# Patient Record
Sex: Male | Born: 1965 | Race: White | Hispanic: No | Marital: Married | Smoking: Current every day smoker
Health system: Southern US, Community
[De-identification: ages and names within clinical notes are randomized; demographics above are authoritative.]

## PROBLEM LIST (undated history)

## (undated) DIAGNOSIS — J449 Chronic obstructive pulmonary disease, unspecified: Secondary | ICD-10-CM

---

## 1998-03-07 ENCOUNTER — Emergency Department (HOSPITAL_COMMUNITY): Admission: EM | Admit: 1998-03-07 | Discharge: 1998-03-07 | Payer: Self-pay | Admitting: Emergency Medicine

## 1999-12-02 ENCOUNTER — Emergency Department (HOSPITAL_COMMUNITY): Admission: EM | Admit: 1999-12-02 | Discharge: 1999-12-02 | Payer: Self-pay | Admitting: Emergency Medicine

## 2002-12-24 ENCOUNTER — Encounter: Payer: Self-pay | Admitting: Emergency Medicine

## 2002-12-24 ENCOUNTER — Emergency Department (HOSPITAL_COMMUNITY): Admission: EM | Admit: 2002-12-24 | Discharge: 2002-12-24 | Payer: Self-pay | Admitting: Emergency Medicine

## 2003-01-04 ENCOUNTER — Encounter: Admission: RE | Admit: 2003-01-04 | Discharge: 2003-04-04 | Payer: Self-pay | Admitting: Orthopedic Surgery

## 2003-03-25 ENCOUNTER — Encounter: Payer: Self-pay | Admitting: Emergency Medicine

## 2003-03-25 ENCOUNTER — Emergency Department (HOSPITAL_COMMUNITY): Admission: AD | Admit: 2003-03-25 | Discharge: 2003-03-25 | Payer: Self-pay | Admitting: Emergency Medicine

## 2006-04-02 ENCOUNTER — Emergency Department (HOSPITAL_COMMUNITY): Admission: EM | Admit: 2006-04-02 | Discharge: 2006-04-02 | Payer: Self-pay | Admitting: Emergency Medicine

## 2008-10-05 ENCOUNTER — Emergency Department (HOSPITAL_COMMUNITY): Admission: EM | Admit: 2008-10-05 | Discharge: 2008-10-06 | Payer: Self-pay | Admitting: Emergency Medicine

## 2016-07-09 ENCOUNTER — Encounter (HOSPITAL_COMMUNITY): Payer: Self-pay | Admitting: Emergency Medicine

## 2016-07-09 ENCOUNTER — Emergency Department (HOSPITAL_COMMUNITY)
Admission: EM | Admit: 2016-07-09 | Discharge: 2016-07-10 | Disposition: A | Discharge status: Home / Self Care | Payer: Self-pay | Attending: Emergency Medicine | Admitting: Emergency Medicine

## 2016-07-09 ENCOUNTER — Emergency Department (HOSPITAL_COMMUNITY): Payer: Self-pay

## 2016-07-09 DIAGNOSIS — J449 Chronic obstructive pulmonary disease, unspecified: Secondary | ICD-10-CM | POA: Insufficient documentation

## 2016-07-09 DIAGNOSIS — Z87891 Personal history of nicotine dependence: Secondary | ICD-10-CM | POA: Insufficient documentation

## 2016-07-09 DIAGNOSIS — K859 Acute pancreatitis without necrosis or infection, unspecified: Secondary | ICD-10-CM | POA: Insufficient documentation

## 2016-07-09 DIAGNOSIS — N201 Calculus of ureter: Secondary | ICD-10-CM | POA: Insufficient documentation

## 2016-07-09 HISTORY — DX: Chronic obstructive pulmonary disease, unspecified: J44.9

## 2016-07-09 LAB — CBC
HCT: 46.3 % (ref 39.0–52.0)
Hemoglobin: 16.3 g/dL (ref 13.0–17.0)
MCH: 29.6 pg (ref 26.0–34.0)
MCHC: 35.2 g/dL (ref 30.0–36.0)
MCV: 84.2 fL (ref 78.0–100.0)
PLATELETS: 278 10*3/uL (ref 150–400)
RBC: 5.5 MIL/uL (ref 4.22–5.81)
RDW: 13.4 % (ref 11.5–15.5)
WBC: 15.6 10*3/uL — ABNORMAL HIGH (ref 4.0–10.5)

## 2016-07-09 LAB — COMPREHENSIVE METABOLIC PANEL
ALT: 28 U/L (ref 17–63)
AST: 30 U/L (ref 15–41)
Albumin: 4.8 g/dL (ref 3.5–5.0)
Alkaline Phosphatase: 97 U/L (ref 38–126)
Anion gap: 14 (ref 5–15)
BUN: 18 mg/dL (ref 6–20)
CHLORIDE: 100 mmol/L — AB (ref 101–111)
CO2: 24 mmol/L (ref 22–32)
CREATININE: 1.1 mg/dL (ref 0.61–1.24)
Calcium: 9.9 mg/dL (ref 8.9–10.3)
GFR calc Af Amer: >60 mL/min (ref >60)
GFR calc non Af Amer: >60 mL/min (ref >60)
Glucose, Bld: 138 mg/dL — ABNORMAL HIGH (ref 65–99)
Potassium: 3.2 mmol/L — ABNORMAL LOW (ref 3.5–5.1)
SODIUM: 138 mmol/L (ref 135–145)
Total Bilirubin: 2.7 mg/dL — ABNORMAL HIGH (ref 0.3–1.2)
Total Protein: 7.6 g/dL (ref 6.5–8.1)

## 2016-07-09 LAB — LIPASE, BLOOD: LIPASE: 116 U/L — AB (ref 11–51)

## 2016-07-09 LAB — I-STAT CG4 LACTIC ACID, ED
Lactic Acid, Venous: 1.23 mmol/L (ref 0.5–1.9)
Lactic Acid, Venous: 4.06 mmol/L (ref 0.5–1.9)

## 2016-07-09 MED ORDER — POTASSIUM CHLORIDE CRYS ER 20 MEQ PO TBCR
40.0000 meq | EXTENDED_RELEASE_TABLET | Freq: Once | ORAL | Status: AC
  Administered 2016-07-09: 40 meq via ORAL
  Filled 2016-07-09: qty 2

## 2016-07-09 MED ORDER — SODIUM CHLORIDE 0.9 % IV BOLUS (SEPSIS)
1000.0000 mL | Freq: Once | INTRAVENOUS | Status: AC
  Administered 2016-07-09: 1000 mL via INTRAVENOUS

## 2016-07-09 MED ORDER — HYDROMORPHONE HCL 1 MG/ML IJ SOLN
1.0000 mg | Freq: Once | INTRAMUSCULAR | Status: AC
  Administered 2016-07-09: 1 mg via INTRAVENOUS
  Filled 2016-07-09: qty 1

## 2016-07-09 MED ORDER — IOPAMIDOL (ISOVUE-300) INJECTION 61%
100.0000 mL | Freq: Once | INTRAVENOUS | Status: AC | PRN
  Administered 2016-07-09: 100 mL via INTRAVENOUS

## 2016-07-09 MED ORDER — METRONIDAZOLE IN NACL 5-0.79 MG/ML-% IV SOLN
500.0000 mg | Freq: Three times a day (TID) | INTRAVENOUS | Status: DC
  Administered 2016-07-09 – 2016-07-10 (×2): 500 mg via INTRAVENOUS
  Filled 2016-07-09 (×2): qty 100

## 2016-07-09 MED ORDER — ONDANSETRON HCL 4 MG/2ML IJ SOLN
4.0000 mg | Freq: Once | INTRAMUSCULAR | Status: AC
  Administered 2016-07-09: 4 mg via INTRAVENOUS
  Filled 2016-07-09: qty 2

## 2016-07-09 MED ORDER — SODIUM CHLORIDE 0.9 % IJ SOLN
INTRAMUSCULAR | Status: AC
  Filled 2016-07-09: qty 50

## 2016-07-09 MED ORDER — CIPROFLOXACIN IN D5W 400 MG/200ML IV SOLN
400.0000 mg | Freq: Two times a day (BID) | INTRAVENOUS | Status: DC
  Administered 2016-07-09: 400 mg via INTRAVENOUS
  Filled 2016-07-09 (×2): qty 200

## 2016-07-09 MED ORDER — IOPAMIDOL (ISOVUE-300) INJECTION 61%
INTRAVENOUS | Status: AC
  Filled 2016-07-09: qty 100

## 2016-07-09 NOTE — ED Notes (Addendum)
Pt. said he still not able to urinate at this time

## 2016-07-09 NOTE — ED Triage Notes (Signed)
Pt comes from visiting with family with complaints of right flank pain and abdominal pain since 1900 last night.  Urine dark in color per patient. 10/10 pain.  No hx of kidney stones.  BP 136/0 HR 80 RR 22 O2 100% RA.  Endorses N&V. Hx of COPD.  4 mg of zofran and 100 mcg of fentanyl given in route without relief.

## 2016-07-09 NOTE — ED Notes (Signed)
Writer notified EDP Tegeler of abnormal I-stat lactic

## 2016-07-09 NOTE — ED Provider Notes (Signed)
[facility]-EMERGENCY DEPT Provider Note   CSN: [id number] Arrival date & time: 07/09/16  1921     History   Chief Complaint Chief Complaint  Patient presents with  . Flank Pain  . Abdominal Pain    HPI Jeffery [name] is a 50 y.o. male with a past medical history significant for COPD who presents with right lower quadrant abdominal pain, right-sided flank pain, darkened urine, nausea, and vomiting. Patient reports that his symptoms began yesterday. He describes the pain as 10 out of 10 in severity, located in his right flank radiating towards his right lower quadrant, and associated with nausea and vomiting. Patient has not been able to eat or drink a significant amount today. He denies constipation, diarrhea, or dysuria. He does say that his urine was slightly dark today. He denies any history himself or his family of kidney stones. He reports chills but no fevers. He denies chest pain, shortness of breath, or any abdominal trauma.        The history is provided by the patient, medical records and a relative. No language interpreter was used.  Abdominal Pain   This is a new problem. The current episode started yesterday. The problem occurs constantly. The problem has been gradually worsening. The pain is located in the RLQ. The quality of the pain is dull and sharp. The pain is at a severity of 10/10. The pain is severe. Associated symptoms include nausea and vomiting. Pertinent negatives include fever, diarrhea, constipation, dysuria and frequency. The symptoms are aggravated by palpation.    Past Medical History:  Diagnosis Date  . COPD (chronic obstructive pulmonary disease) (HCC)     There are no active problems to display for this patient.   History reviewed. No pertinent surgical history.     Home Medications    Prior to Admission medications   Not on File    Family History History reviewed. No pertinent family history.  Social History Social History  Substance Use  Topics  . Smoking status: Former Games developer  . Smokeless tobacco: Never Used  . Alcohol use Not on file     Allergies   Amoxicillin   Review of Systems Review of Systems  Constitutional: Negative for chills, diaphoresis, fatigue and fever.  HENT: Negative for congestion.   Respiratory: Negative for chest tightness, shortness of breath, wheezing and stridor.   Cardiovascular: Negative for chest pain, palpitations and leg swelling.  Gastrointestinal: Positive for abdominal pain, nausea and vomiting. Negative for abdominal distention, constipation and diarrhea.  Genitourinary: Negative for decreased urine volume, dysuria, flank pain and frequency.  Musculoskeletal: Negative for back pain and neck stiffness.  Skin: Negative for rash and wound.  Neurological: Negative for dizziness, light-headedness and numbness.  Psychiatric/Behavioral: Negative for agitation and confusion.  All other systems reviewed and are negative.    Physical Exam Updated Vital Signs BP 122/89 (BP Location: Right Arm)   Pulse 89   Temp 99 F (37.2 C) (Oral)   Resp 20   Ht 5\' 9"  (1.753 m)   Wt 145 lb (65.8 kg)   SpO2 100%   BMI 21.41 kg/m   Physical Exam  Constitutional: He is oriented to person, place, and time. He appears well-developed and well-nourished.  HENT:  Head: Normocephalic and atraumatic.  Mouth/Throat: Oropharynx is clear and moist. No oropharyngeal exudate.  Eyes: Conjunctivae and EOM are normal. Pupils are equal, round, and reactive to light.  Neck: Normal range of motion. Neck supple.  Cardiovascular: Normal rate and regular  rhythm.   No murmur heard. Pulmonary/Chest: Effort normal and breath sounds normal. No stridor. No respiratory distress. He has no wheezes. He exhibits no tenderness.  Abdominal: Soft. There is tenderness.  Musculoskeletal: He exhibits no edema or tenderness.  Neurological: He is alert and oriented to person, place, and time. He displays normal reflexes. No cranial  nerve deficit or sensory deficit. He exhibits normal muscle tone. Coordination normal.  Skin: Skin is warm and dry. Capillary refill takes less than 2 seconds. No erythema. No pallor.  Psychiatric: He has a normal mood and affect.  Nursing note and vitals reviewed.    ED Treatments / Results  Labs (all labs ordered are listed, but only abnormal results are displayed) Labs Reviewed  LIPASE, BLOOD - Abnormal; Notable for the following:       Result Value   Lipase 116 (*)    All other components within normal limits  COMPREHENSIVE METABOLIC PANEL - Abnormal; Notable for the following:    Potassium 3.2 (*)    Chloride 100 (*)    Glucose, Bld 138 (*)    Total Bilirubin 2.7 (*)    All other components within normal limits  CBC - Abnormal; Notable for the following:    WBC 15.6 (*)    All other components within normal limits  URINALYSIS, ROUTINE W REFLEX MICROSCOPIC (NOT AT [facility]) - Abnormal; Notable for the following:    Specific Gravity, Urine >1.046 (*)    Ketones, ur 40 (*)    Protein, ur 30 (*)    All other components within normal limits  URINE MICROSCOPIC-ADD ON - Abnormal; Notable for the following:    Squamous Epithelial / LPF 0-5 (*)    Bacteria, UA RARE (*)    All other components within normal limits  I-STAT CG4 LACTIC ACID, ED - Abnormal; Notable for the following:    Lactic Acid, Venous 4.06 (*)    All other components within normal limits  CULTURE, BLOOD (ROUTINE X 2)  CULTURE, BLOOD (ROUTINE X 2)  I-STAT CG4 LACTIC ACID, ED    EKG  EKG Interpretation None       Radiology Ct Abdomen Pelvis W Contrast  Result Date: 07/09/2016 CLINICAL DATA:  50 y/o  M; right flank pain and dark urine. EXAM: CT ABDOMEN AND PELVIS WITH CONTRAST TECHNIQUE: Multidetector CT imaging of the abdomen and pelvis was performed using the standard protocol following bolus administration of intravenous contrast. CONTRAST:  1 ISOVUE-300 IOPAMIDOL (ISOVUE-300) INJECTION 61% COMPARISON:   None. FINDINGS: Lower chest: 3 mm left costal diaphragmatic angle nodule (series 4, image 40). Hepatobiliary: Subcentimeter lucencies in segment 5 and segment 7 of the liver likely representing cysts. No other focal liver abnormality. Normal gallbladder. No intra or extrahepatic biliary ductal dilatation. Pancreas: Unremarkable. No pancreatic ductal dilatation or surrounding inflammatory changes. Spleen: Normal in size without focal abnormality. Adrenals/Urinary Tract: Adrenal glands are unremarkable. Subcentimeter foci of hypoattenuation within the kidneys bilaterally the largest in the left lower pole measuring 6 mm probably represent cysts. Kidneys are otherwise normal, without renal calculi, focal lesion, or hydronephrosis. Bladder is unremarkable. Stomach/Bowel: Stomach is within normal limits. Appendix appears normal. No evidence of bowel wall thickening, distention, or inflammatory changes. Vascular/Lymphatic: Aortic atherosclerosis. No enlarged abdominal or pelvic lymph nodes. Reproductive: 2 mm calcification within the central prostate the expected location of prostatic ureter may represent a passing stone (series 2, image 74). Other: No abdominal wall hernia or abnormality. No abdominopelvic ascites. Musculoskeletal: No acute or significant osseous findings.  IMPRESSION: 1. No hydronephrosis or obstructive uropathy. 2. 2 mm calcification within central prostate expected location of prostatic ureter may represent a passing stone. 3. Mild aortic atherosclerosis. 4. 3 mm left lower lobe pulmonary nodule. No follow-up needed if patient is low-risk. Non-contrast chest CT can be considered in 12 months if patient is high-risk. This recommendation follows the consensus statement: Guidelines for Management of Incidental Pulmonary Nodules Detected on CT Images: From the Fleischner Society 2017; Radiology 2017; 284:228-243. Electronically Signed   By: Mitzi HansenLance  Furusawa-Stratton M.D.   On: 07/09/2016 22:17     Procedures Procedures (including critical care time)  Medications Ordered in ED Medications  sodium chloride 0.9 % bolus 1,000 mL (0 mLs Intravenous Stopped 07/09/16 2217)  sodium chloride 0.9 % bolus 1,000 mL (0 mLs Intravenous Stopped 07/09/16 2325)  HYDROmorphone (DILAUDID) injection 1 mg (1 mg Intravenous Given 07/09/16 2116)  ondansetron (ZOFRAN) injection 4 mg (4 mg Intravenous Given 07/09/16 2116)  potassium chloride SA (K-DUR,KLOR-CON) CR tablet 40 mEq (40 mEq Oral Given 07/09/16 2116)  iopamidol (ISOVUE-300) 61 % injection 100 mL (100 mLs Intravenous Contrast Given 07/09/16 2154)  sodium chloride 0.9 % bolus 1,000 mL (0 mLs Intravenous Stopped 07/10/16 0058)  ondansetron (ZOFRAN) injection 4 mg (4 mg Intravenous Given 07/10/16 0057)  HYDROmorphone (DILAUDID) injection 1 mg (1 mg Intravenous Given 07/10/16 0058)  oxyCODONE-acetaminophen (PERCOCET/ROXICET) 5-325 MG per tablet 1 tablet (1 tablet Oral Given 07/10/16 0706)     Initial Impression / Assessment and Plan / ED Course  I have reviewed the triage vital signs and the nursing notes.  Pertinent labs & imaging results that were available during my care of the patient were reviewed by me and considered in my medical decision making (see chart for details).  Clinical Course     Blondell RevealGlen B Binney is a 50 y.o. male with a past medical history significant for COPD who presents with right lower quadrant abdominal pain, right-sided flank pain, darkened urine, nausea, and vomiting.  History and exam are seen above.  Patient has tenderness in his right flank, and right lower quadrant. No significant CVA tenderness. No scrotal swelling. No penile lesions. No hernias. Cremasteric reflex symmetric. Lungs clear and neurologic exam unremarkable.  Laboratory and imaging testing ordered to rule out appendicitis versus kidney stone. Initial lactic acid elevated at 4.06. After this finding, patient was made a code sepsis and broad-spectrum  antibiotics were ordered to cover for intra-abdominal sections. Leukocytosis was found at 15.6. Lipase slightly elevated at 116.  CT imaging ordered with no evidence of appendicitis. There is a 2 mm kidney stone in the prostatic ureter. No evidence of hydronephrosis. No evidence of bowel abnormalities.  He reported improvement in pain after pain medicine, improvement in nausea after Zofran, and felt better in general after fluids.  Patient is still awaiting urinalysis to look for UTI.  Patient's lactic acid cleared after fluids. Suspect dehydration in setting of nausea and vomiting. Suspect symptoms secondary to passing kidney stone. If urinalysis is negative for infection, and patient has continued resolution of symptoms, feel patient is stable for discharge. Patient has evidence of UTI or unmanaged symptoms, would consider admitting patient given significantly elevated lactic acid on arrival.  Care transferred to Dr. Lynelle DoctorKnapp to await urinalysis results and reassessment following pain medication administration. Care transferred in stable condition.    Final Clinical Impressions(s) / ED Diagnoses   Final diagnoses:  Right ureteral stone  Acute pancreatitis without infection or necrosis, unspecified pancreatitis type  Clinical Impression: 1. Right ureteral stone   2. Acute pancreatitis without infection or necrosis, unspecified pancreatitis type     Disposition: Transfer to Dr. Lynelle Doctor with likely subsequent discharge  Condition: Stable      Canary Brim Ras Kollman, MD 07/10/16 [phone]

## 2016-07-09 NOTE — ED Triage Notes (Signed)
Pt presents to ED via [facility] with c/o Right lower quadrant and right flank pain since yesterday, associated with nausea and vomiting and chills.

## 2016-07-09 NOTE — ED Notes (Signed)
Bed: [facility]02 Expected date:  Expected time:  Means of arrival:  Comments: EMS abd and flank pain/IV fentanyl and zofran

## 2016-07-10 LAB — URINALYSIS, ROUTINE W REFLEX MICROSCOPIC
Bilirubin Urine: NEGATIVE
Glucose, UA: NEGATIVE mg/dL
HGB URINE DIPSTICK: NEGATIVE
Ketones, ur: 40 mg/dL — AB
Leukocytes, UA: NEGATIVE
NITRITE: NEGATIVE
PH: 8 (ref 5.0–8.0)
Protein, ur: 30 mg/dL — AB

## 2016-07-10 LAB — URINE MICROSCOPIC-ADD ON

## 2016-07-10 MED ORDER — PERCOCET 5-325 MG PO TABS: 1.0000 | ORAL_TABLET | Freq: Four times a day (QID) | ORAL | 0 refills | Status: DC | PRN

## 2016-07-10 MED ORDER — HYDROMORPHONE HCL 1 MG/ML IJ SOLN
1.0000 mg | Freq: Once | INTRAMUSCULAR | Status: AC
  Administered 2016-07-10: 1 mg via INTRAVENOUS
  Filled 2016-07-10: qty 1

## 2016-07-10 MED ORDER — ONDANSETRON 4 MG PO TBDP: 4.0000 mg | ORAL_TABLET | Freq: Three times a day (TID) | ORAL | 0 refills | Status: DC | PRN

## 2016-07-10 MED ORDER — ONDANSETRON HCL 4 MG/2ML IJ SOLN
4.0000 mg | Freq: Once | INTRAMUSCULAR | Status: AC
  Administered 2016-07-10: 4 mg via INTRAVENOUS
  Filled 2016-07-10: qty 2

## 2016-07-10 MED ORDER — OXYCODONE-ACETAMINOPHEN 5-325 MG PO TABS
1.0000 | ORAL_TABLET | Freq: Once | ORAL | Status: AC
  Administered 2016-07-10: 1 via ORAL
  Filled 2016-07-10: qty 1

## 2016-07-10 NOTE — Discharge Instructions (Addendum)
Your laboratory results show you have a mild inflammation of your pancreas. It is so mild that it does not show up on the CT scan. You also had a kidney stone that the CT scan shows you were close to passing while in the ED.  do not eat any solid food for the next 2 days, only drink clear liquids. Take the pain medication as prescribed, use the nausea medication as needed. Return to the ED if you get a fever, have uncontrolled vomiting or pain.

## 2016-07-10 NOTE — ED Notes (Signed)
Pt understood discharge instructions.

## 2016-07-10 NOTE — ED Provider Notes (Signed)
Patient left at change of shift to check urinalysis results. However due to other blood testing that was done it never triggered the color change for me to know what was done. Patient was rechecked at 6:45 AM. He states his pain is returning. He shows me is having epigastric pain that radiates into his back. He denies any alcohol abuse. His lipase was noted to be elevated however his CT scan does not show any acute inflammation of the pancreas. This pain would be consistent with pancreatitis which is probably mild since it is not very evident on CT scan. Patient is willing to try oral pain medication so he can go home. At this point he does not have any pain in his groin to go along with the possible prostatic ureteral stone that was seen on CT scan.  Recheck at 7:50 AM after getting oral Percocet. He states his pain is improved. We discussed going home and he wants to try outpatient treatment before admission.  Review of the [place] database shows no entries in the last 6 months.  Diagnoses that have been ruled out:  None  Diagnoses that are still under consideration:  None  Final diagnoses:  Right ureteral stone  Acute pancreatitis without infection or necrosis, unspecified pancreatitis type   New Prescriptions   ONDANSETRON (ZOFRAN ODT) 4 MG DISINTEGRATING TABLET    Take 1 tablet (4 mg total) by mouth every 8 (eight) hours as needed for nausea or vomiting.   PERCOCET 5-325 MG TABLET    Take 1 tablet by mouth every 6 (six) hours as needed for severe pain.    Plan discharge  Devoria AlbeIva Jeyson Deshotel, MD, [facility]P    Arlind Klingerman, MD 07/10/16 [phone]

## 2016-07-15 LAB — CULTURE, BLOOD (ROUTINE X 2)
Culture: NO GROWTH
Culture: NO GROWTH

## 2018-02-01 ENCOUNTER — Emergency Department (HOSPITAL_COMMUNITY): Payer: Self-pay

## 2018-02-01 ENCOUNTER — Other Ambulatory Visit: Payer: Self-pay

## 2018-02-01 ENCOUNTER — Emergency Department (HOSPITAL_COMMUNITY)
Admission: EM | Admit: 2018-02-01 | Discharge: 2018-02-01 | Disposition: A | Discharge status: Home / Self Care | Payer: Self-pay | Attending: Emergency Medicine | Admitting: Emergency Medicine

## 2018-02-01 ENCOUNTER — Encounter (HOSPITAL_COMMUNITY): Payer: Self-pay

## 2018-02-01 DIAGNOSIS — R1032 Left lower quadrant pain: Secondary | ICD-10-CM | POA: Insufficient documentation

## 2018-02-01 DIAGNOSIS — L729 Follicular cyst of the skin and subcutaneous tissue, unspecified: Secondary | ICD-10-CM | POA: Insufficient documentation

## 2018-02-01 DIAGNOSIS — Z79899 Other long term (current) drug therapy: Secondary | ICD-10-CM | POA: Insufficient documentation

## 2018-02-01 DIAGNOSIS — J449 Chronic obstructive pulmonary disease, unspecified: Secondary | ICD-10-CM | POA: Insufficient documentation

## 2018-02-01 DIAGNOSIS — Z87891 Personal history of nicotine dependence: Secondary | ICD-10-CM | POA: Insufficient documentation

## 2018-02-01 DIAGNOSIS — R748 Abnormal levels of other serum enzymes: Secondary | ICD-10-CM | POA: Insufficient documentation

## 2018-02-01 DIAGNOSIS — N433 Hydrocele, unspecified: Secondary | ICD-10-CM | POA: Insufficient documentation

## 2018-02-01 DIAGNOSIS — I861 Scrotal varices: Secondary | ICD-10-CM | POA: Insufficient documentation

## 2018-02-01 DIAGNOSIS — R109 Unspecified abdominal pain: Secondary | ICD-10-CM

## 2018-02-01 LAB — COMPREHENSIVE METABOLIC PANEL
ALT: 27 U/L (ref 17–63)
AST: 22 U/L (ref 15–41)
Albumin: 4 g/dL (ref 3.5–5.0)
Alkaline Phosphatase: 97 U/L (ref 38–126)
Anion gap: 7 (ref 5–15)
BUN: 16 mg/dL (ref 6–20)
CHLORIDE: 100 mmol/L — AB (ref 101–111)
CO2: 29 mmol/L (ref 22–32)
CREATININE: 1.26 mg/dL — AB (ref 0.61–1.24)
Calcium: 9.4 mg/dL (ref 8.9–10.3)
GFR calc Af Amer: >60 mL/min (ref >60)
Glucose, Bld: 138 mg/dL — ABNORMAL HIGH (ref 65–99)
Potassium: 4.4 mmol/L (ref 3.5–5.1)
Sodium: 136 mmol/L (ref 135–145)
Total Bilirubin: 1 mg/dL (ref 0.3–1.2)
Total Protein: 7 g/dL (ref 6.5–8.1)

## 2018-02-01 LAB — CBC
HCT: 50.1 % (ref 39.0–52.0)
Hemoglobin: 16.5 g/dL (ref 13.0–17.0)
MCH: 29.4 pg (ref 26.0–34.0)
MCHC: 32.9 g/dL (ref 30.0–36.0)
MCV: 89.3 fL (ref 78.0–100.0)
PLATELETS: 271 10*3/uL (ref 150–400)
RBC: 5.61 MIL/uL (ref 4.22–5.81)
RDW: 13.5 % (ref 11.5–15.5)
WBC: 13.1 10*3/uL — AB (ref 4.0–10.5)

## 2018-02-01 LAB — URINALYSIS, ROUTINE W REFLEX MICROSCOPIC
BILIRUBIN URINE: NEGATIVE
GLUCOSE, UA: 50 mg/dL — AB
Hgb urine dipstick: NEGATIVE
KETONES UR: NEGATIVE mg/dL
LEUKOCYTES UA: NEGATIVE
Nitrite: NEGATIVE
PROTEIN: NEGATIVE mg/dL
Specific Gravity, Urine: 1.014 (ref 1.005–1.030)
pH: 7 (ref 5.0–8.0)

## 2018-02-01 LAB — LIPASE, BLOOD: LIPASE: 97 U/L — AB (ref 11–51)

## 2018-02-01 MED ORDER — IOHEXOL 300 MG/ML  SOLN
100.0000 mL | Freq: Once | INTRAMUSCULAR | Status: AC
  Administered 2018-02-01: 100 mL via INTRAVENOUS

## 2018-02-01 MED ORDER — SODIUM CHLORIDE 0.9 % IV BOLUS
1000.0000 mL | Freq: Once | INTRAVENOUS | Status: AC
  Administered 2018-02-01: 1000 mL via INTRAVENOUS

## 2018-02-01 MED ORDER — ONDANSETRON 4 MG PO TBDP: 4.0000 mg | ORAL_TABLET | Freq: Three times a day (TID) | ORAL | 0 refills | Status: DC | PRN

## 2018-02-01 MED ORDER — ONDANSETRON HCL 4 MG/2ML IJ SOLN
4.0000 mg | Freq: Once | INTRAMUSCULAR | Status: AC
  Administered 2018-02-01: 4 mg via INTRAVENOUS
  Filled 2018-02-01: qty 2

## 2018-02-01 MED ORDER — HYDROMORPHONE HCL 2 MG/ML IJ SOLN
1.0000 mg | Freq: Once | INTRAMUSCULAR | Status: AC
  Administered 2018-02-01: 1 mg via INTRAVENOUS
  Filled 2018-02-01: qty 1

## 2018-02-01 MED ORDER — OXYCODONE-ACETAMINOPHEN 5-325 MG PO TABS
1.0000 | ORAL_TABLET | Freq: Once | ORAL | Status: AC
  Administered 2018-02-01: 1 via ORAL
  Filled 2018-02-01: qty 1

## 2018-02-01 MED ORDER — OXYCODONE-ACETAMINOPHEN 5-325 MG PO TABS: 1.0000 | ORAL_TABLET | ORAL | 0 refills | Status: DC | PRN

## 2018-02-01 NOTE — ED Notes (Signed)
Patient transported to CT 

## 2018-02-01 NOTE — ED Triage Notes (Addendum)
Pt reports abdominal pain X2 days. He reports some pain in the right side of his back as well. Pt reports nausea and vomiting as well as testicle pain. Pt reports he is unable to have a steady stream of urine. Hx of kidney stone.

## 2018-02-01 NOTE — ED Notes (Signed)
Pt provided with ice water

## 2018-02-01 NOTE — ED Notes (Signed)
EDP at the bedside.  ?

## 2018-02-01 NOTE — Discharge Instructions (Signed)
Thank you for allowing me to provide your care today in the emergency department.  Please call and schedule follow-up with urology.  Dr. Alphonsa OverallWinter's number as listed above.  For mild to moderate pain, take 600 mg of ibuprofen with food or 6 and 50 mg of Tylenol every 6 hours for pain control.  For severe pain, you can take 1 tablet of Percocet every 8 hours as needed.  Please do not drive or work while taking this medication because it is a narcotic and can cause you to be impaired.   You can also let 1 tablet of Zofran dissolve in your tongue every 8 hours as needed for nausea or vomiting.  Return to the emergency department if you develop new or worsening symptoms including persistent vomiting despite taking Zofran, severe uncontrollable pain, high fever, or other new concerning symptoms.

## 2018-02-01 NOTE — ED Provider Notes (Signed)
[facility] EMERGENCY DEPARTMENT Provider Note   CSN: [id number] Arrival date & time: 02/01/18  4098     History   Chief Complaint Chief Complaint  Patient presents with  . Abdominal Pain    HPI Jeffery [name] is a 52 y.o. male with a h/o of COPD and nephrolithiasis who presents to the Emergency Department with a chief complaint of abdominal pain.  The patient endorses constant, worsening left-sided abdominal pain, characterized as sharp, that began 2 days ago.  He reports the pain initially felt like when he had previously had a kidney stone. Abdominal pain is now diffuse. He also endorses nausea, NBNB emesis x2 that began yesterday afternoon, and left-sided testicular pain.  He states that he felt warm last night like he might have a temperature, but did not measure his temperature at home.  He also reports increased pressure and discomfort with voiding over the last few weeks.  He denies dysuria, frequency, hematuria, penile or testicular swelling, penile discharge, diarrhea, constipation, rectal pain, melena, hematochezia, or chills.  He took 400 mg of ibuprofen this morning with no improvement in his symptoms.  No other treatment prior to arrival.  He does not have a PCP.  He denies alcohol use and states that he has not drank alcohol in over 5 years.  He is a former smoker.  No recreational or IV drug use.  The history is provided by the patient. No language interpreter was used.    Past Medical History:  Diagnosis Date  . COPD (chronic obstructive pulmonary disease) (HCC)     There are no active problems to display for this patient.   History reviewed. No pertinent surgical history.    Home Medications    Prior to Admission medications   Medication Sig Start Date End Date Taking? Authorizing Provider  ibuprofen (ADVIL,MOTRIN) 200 MG tablet Take 400 mg by mouth every 6 (six) hours as needed for moderate pain.   Yes [provider]    ondansetron (ZOFRAN ODT) 4 MG disintegrating tablet Take 1 tablet (4 mg total) by mouth every 8 (eight) hours as needed for nausea or vomiting. 02/01/18   Daphyne Miguez A, PA-C  oxyCODONE-acetaminophen (PERCOCET/ROXICET) 5-325 MG tablet Take 1 tablet by mouth every 4 (four) hours as needed for severe pain. 02/01/18   Laiylah Roettger, Coral Else, PA-C    Family History History reviewed. No pertinent family history.  Social History Social History   Tobacco Use  . Smoking status: Former Games developer  . Smokeless tobacco: Never Used  Substance Use Topics  . Alcohol use: Not Currently  . Drug use: Not Currently     Allergies   Amoxicillin   Review of Systems Review of Systems  Constitutional: Negative for appetite change and fever.  Respiratory: Negative for shortness of breath.   Cardiovascular: Negative for chest pain.  Gastrointestinal: Positive for abdominal pain, nausea and vomiting. Negative for abdominal distention, anal bleeding, blood in stool, constipation, diarrhea and rectal pain.  Genitourinary: Positive for flank pain, frequency, testicular pain and urgency. Negative for discharge, dysuria, genital sores, hematuria, penile pain, penile swelling and scrotal swelling.  Musculoskeletal: Positive for back pain. Negative for myalgias, neck pain and neck stiffness.  Skin: Negative for rash.  Allergic/Immunologic: Negative for immunocompromised state.  Neurological: Negative for headaches.  Psychiatric/Behavioral: Negative for confusion.   Physical Exam Updated Vital Signs BP (!) 131/98   Pulse 66   Temp 98.6 F (37 C) (Oral)   Resp 16  SpO2 100%   Physical Exam  Constitutional: He appears well-developed.  HENT:  Head: Normocephalic.  Eyes: Conjunctivae are normal.  Neck: Normal range of motion. Neck supple. No JVD present.  Cardiovascular: Normal rate, regular rhythm, normal heart sounds and intact distal pulses. Exam reveals no gallop and no friction rub.  No murmur  heard. Pulmonary/Chest: Effort normal and breath sounds normal. No stridor. No respiratory distress. He has no wheezes. He has no rales. He exhibits no tenderness.  Abdominal: Soft. He exhibits no distension and no mass. There is tenderness. There is guarding. No hernia. Hernia confirmed negative in the right inguinal area and confirmed negative in the left inguinal area.  Genitourinary: Rectum normal, prostate normal and penis normal. Rectal exam shows no external hemorrhoid and anal tone normal. Right testis shows no tenderness. Left testis shows tenderness.  Genitourinary Comments: Chaperoned exam.  Bilateral inguinal lymphadenopathy.  No inguinal hernias bilaterally.  Rectal exam is normal.  Musculoskeletal: Normal range of motion. He exhibits tenderness. He exhibits no edema or deformity.  No tenderness to the spinous processes of the cervical, thoracic, or lumbar spine.  He has midline tenderness to the sacral spine with minimal surrounding bilateral paraspinal muscular tenderness.  Lymphadenopathy: Inguinal adenopathy noted on the right and left side.  Neurological: He is alert.  Skin: Skin is warm and dry. Capillary refill takes less than 2 seconds. No rash noted. No erythema. No pallor.  Psychiatric: His behavior is normal.  Nursing note and vitals reviewed.  ED Treatments / Results  Labs (all labs ordered are listed, but only abnormal results are displayed) Labs Reviewed  LIPASE, BLOOD - Abnormal; Notable for the following components:      Result Value   Lipase 97 (*)    All other components within normal limits  COMPREHENSIVE METABOLIC PANEL - Abnormal; Notable for the following components:   Chloride 100 (*)    Glucose, Bld 138 (*)    Creatinine, Ser 1.26 (*)    All other components within normal limits  CBC - Abnormal; Notable for the following components:   WBC 13.1 (*)    All other components within normal limits  URINALYSIS, ROUTINE W REFLEX MICROSCOPIC - Abnormal;  Notable for the following components:   Glucose, UA 50 (*)    All other components within normal limits    EKG None  Radiology Ct Abdomen Pelvis W Contrast  Result Date: 02/01/2018 CLINICAL DATA:  Nausea, vomiting EXAM: CT ABDOMEN AND PELVIS WITH CONTRAST TECHNIQUE: Multidetector CT imaging of the abdomen and pelvis was performed using the standard protocol following bolus administration of intravenous contrast. CONTRAST:  OMNIPAQUE IOHEXOL 300 MG/ML  SOLN COMPARISON:  02/01/2017 FINDINGS: Lower chest: Lung bases are clear. No effusions. Heart is normal size. Hepatobiliary: Small scattered hypodensities in the liver, likely cysts. Gallbladder unremarkable. Pancreas: No focal abnormality or ductal dilatation. Spleen: No focal abnormality.  Normal size. Adrenals/Urinary Tract: No adrenal abnormality. No focal renal abnormality. No stones or hydronephrosis. Urinary bladder is unremarkable. Stomach/Bowel: Normal appendix. Stomach, large and small bowel grossly unremarkable. Vascular/Lymphatic: Aortic atherosclerosis. No enlarged abdominal or pelvic lymph nodes. Reproductive: No visible focal abnormality. Other: No free fluid or free air. Musculoskeletal: No acute bony abnormality. IMPRESSION: No acute findings in the abdomen or pelvis. Aortic atherosclerosis. Scattered hypodensities in the liver, likely small cysts. These are stable since prior study. Electronically Signed   By: Charlett Nose M.D.   On: 02/01/2018 12:11   US Scrotum W/doppler  Result Date: 02/01/2018  CLINICAL DATA:  Scrotal region pain on the left for 2 days EXAM: SCROTAL ULTRASOUND DOPPLER ULTRASOUND OF THE TESTICLES TECHNIQUE: Complete ultrasound examination of the testicles, epididymis, and other scrotal structures was performed. Color and spectral Doppler ultrasound were also utilized to evaluate blood flow to the testicles. COMPARISON:  None. FINDINGS: Right testicle Measurements: 4.6 x 2.9 x 2.6 cm. No mass or microlithiasis  visualized. Left testicle Measurements: 4.4 x 2.7 x 3.1 cm. No mass or microlithiasis visualized. Right epididymis: There is a cyst arising from the head of the epididymis on the right measuring 6 x 6 x 6 mm. No inflammatory focus noted in the right epididymis. Left epididymis: Normal in size and appearance. No inflammatory focus. Hydrocele: There is a small hydrocele on the right. There is no appreciable hydrocele on the left. Varicocele: There is a moderate varicocele on the left with Valsalva maneuver. No appreciable varicocele on the right. Pulsed Doppler interrogation of both testes demonstrates normal low resistance arterial and venous waveforms bilaterally. No scrotal wall thickening or scrotal abscess on either side. IMPRESSION: 1. Small cyst arising from the epididymal head on the right. No epididymitis on either side. No evidence of orchitis on either side. 2. No intratesticular mass on either side. No testicular torsion on either side. 3.  Small right right hydrocele.  No appreciable left hydrocele. 4.  Moderate varicocele on the left noted with Valsalva maneuver. Electronically Signed   By: Bretta Bang III M.D.   On: 02/01/2018 10:12    Procedures Procedures (including critical care time)  Medications Ordered in ED Medications  ondansetron (ZOFRAN) injection 4 mg (4 mg Intravenous Given 02/01/18 1020)  HYDROmorphone (DILAUDID) injection 1 mg (1 mg Intravenous Given 02/01/18 1022)  iohexol (OMNIPAQUE) 300 MG/ML solution 100 mL (100 mLs Intravenous Contrast Given 02/01/18 1153)  sodium chloride 0.9 % bolus 1,000 mL (0 mLs Intravenous Stopped 02/01/18 1554)  oxyCODONE-acetaminophen (PERCOCET/ROXICET) 5-325 MG per tablet 1 tablet (1 tablet Oral Given 02/01/18 1447)     Initial Impression / Assessment and Plan / ED Course  I have reviewed the triage vital signs and the nursing notes.  Pertinent labs & imaging results that were available during my care of the patient were reviewed by me and  considered in my medical decision making (see chart for details).     52 year old male with a history of COPD and nephrolithiasis presenting with left-sided abdominal and scrotal pain, nausea, and vomiting.   Pain controlled in the ED.  Zofran given for nausea.  On exam, the patient is significantly tender to palpation of the left scrotum.  Right scrotum is nontender.  Given acute onset of severe, unilateral testicular pain, scrotal ultrasound ordered, which is negative for testicular torsion.  Ultrasound does demonstrate a small cyst arising from the right epididymal head, but no epididymitis or orchitis.  There is also a small right hydrocele and moderate varicocele on the left with Valsalva maneuver.  On reevaluation, the patient reports he has previously noted the varicocele on previous testicular exams.  This is not acute.  CT abdomen pelvis is unremarkable.  No nephro or ureterolithiasis.  On reevaluation, the patient reports that the previous pain medicine has worn off and he is now in a significant amount of pain.  Percocet ordered for reevaluation.    Labs are notable for lipase of 97.  The patient denies alcohol use.  It is noted that when the patient was admitted in November 2017 that his lipase was also elevated at  that time.  I discussed these findings with the patient and have recommended outpatient follow-up for further evaluation.  Creatinine minimally elevated at 1.26.  IV fluid bolus given.  Mild leukocytosis of 13.1.  UA with mild glucosuria, but otherwise unremarkable.  The patient was also successfully fluid challenged.  The patient was discussed with Dr. Rhunette CroftNanavati, attending physician.  No clinical findings to suggest prostatitis, epididymitis, or orchitis.  The patient has no risk factors for Fournier's.  No evidence of GU cellulitis.   Recommended follow-up with urology.  Will discharge the patient with pain control.  A 3883-month prescription history query was performed using  the [place] CSRS prior to discharge.  He is hemodynamically stable and in no acute distress.  Strict return precautions to the ED are given.  He is safe for discharge to home with outpatient follow-up to urology at this time.   Final Clinical Impressions(s) / ED Diagnoses   Final diagnoses:  Elevated lipase  Left sided abdominal pain  Left varicocele  Hydrocele, right  Scrotal cyst    ED Discharge Orders        Ordered    oxyCODONE-acetaminophen (PERCOCET/ROXICET) 5-325 MG tablet  Every 4 hours PRN     02/01/18 1548    ondansetron (ZOFRAN ODT) 4 MG disintegrating tablet  Every 8 hours PRN     02/01/18 1549       Kallen Delatorre A, PA-C 02/01/18 1726    Derwood KaplanNanavati, Ankit, MD 02/03/18 1506

## 2018-02-01 NOTE — ED Notes (Signed)
Pt returned from imaging.

## 2021-05-07 ENCOUNTER — Emergency Department (HOSPITAL_COMMUNITY): Payer: Self-pay

## 2021-05-07 ENCOUNTER — Inpatient Hospital Stay (HOSPITAL_COMMUNITY)
Admission: EM | Admit: 2021-05-07 | Discharge: 2021-05-14 | DRG: 328 | Disposition: A | Discharge status: Home / Self Care | Payer: Self-pay | Attending: General Surgery | Admitting: General Surgery

## 2021-05-07 ENCOUNTER — Inpatient Hospital Stay (HOSPITAL_COMMUNITY): Payer: Self-pay

## 2021-05-07 ENCOUNTER — Encounter (HOSPITAL_COMMUNITY): Admission: EM | Disposition: A | Discharge status: Home / Self Care | Payer: Self-pay | Source: Home / Self Care

## 2021-05-07 ENCOUNTER — Emergency Department (HOSPITAL_COMMUNITY): Payer: Self-pay | Admitting: Anesthesiology

## 2021-05-07 ENCOUNTER — Other Ambulatory Visit: Payer: Self-pay

## 2021-05-07 ENCOUNTER — Encounter (HOSPITAL_COMMUNITY): Payer: Self-pay

## 2021-05-07 DIAGNOSIS — Z87891 Personal history of nicotine dependence: Secondary | ICD-10-CM

## 2021-05-07 DIAGNOSIS — Z4889 Encounter for other specified surgical aftercare: Secondary | ICD-10-CM

## 2021-05-07 DIAGNOSIS — Z9889 Other specified postprocedural states: Secondary | ICD-10-CM

## 2021-05-07 DIAGNOSIS — R1084 Generalized abdominal pain: Secondary | ICD-10-CM

## 2021-05-07 DIAGNOSIS — K275 Chronic or unspecified peptic ulcer, site unspecified, with perforation: Secondary | ICD-10-CM | POA: Diagnosis present

## 2021-05-07 DIAGNOSIS — Z79899 Other long term (current) drug therapy: Secondary | ICD-10-CM

## 2021-05-07 DIAGNOSIS — Z20822 Contact with and (suspected) exposure to covid-19: Secondary | ICD-10-CM | POA: Diagnosis present

## 2021-05-07 DIAGNOSIS — B9681 Helicobacter pylori [H. pylori] as the cause of diseases classified elsewhere: Secondary | ICD-10-CM | POA: Diagnosis present

## 2021-05-07 DIAGNOSIS — Z4659 Encounter for fitting and adjustment of other gastrointestinal appliance and device: Secondary | ICD-10-CM

## 2021-05-07 DIAGNOSIS — Z87442 Personal history of urinary calculi: Secondary | ICD-10-CM

## 2021-05-07 DIAGNOSIS — Z880 Allergy status to penicillin: Secondary | ICD-10-CM

## 2021-05-07 DIAGNOSIS — K668 Other specified disorders of peritoneum: Secondary | ICD-10-CM

## 2021-05-07 DIAGNOSIS — J449 Chronic obstructive pulmonary disease, unspecified: Secondary | ICD-10-CM | POA: Diagnosis present

## 2021-05-07 DIAGNOSIS — K255 Chronic or unspecified gastric ulcer with perforation: Principal | ICD-10-CM | POA: Diagnosis present

## 2021-05-07 DIAGNOSIS — R739 Hyperglycemia, unspecified: Secondary | ICD-10-CM | POA: Diagnosis not present

## 2021-05-07 DIAGNOSIS — Z7982 Long term (current) use of aspirin: Secondary | ICD-10-CM

## 2021-05-07 HISTORY — PX: LAPAROTOMY: SHX154

## 2021-05-07 LAB — CBC WITH DIFFERENTIAL/PLATELET
Abs Immature Granulocytes: 0.09 10*3/uL — ABNORMAL HIGH (ref 0.00–0.07)
Basophils Absolute: 0.1 10*3/uL (ref 0.0–0.1)
Basophils Relative: 0 %
Eosinophils Absolute: 0 10*3/uL (ref 0.0–0.5)
Eosinophils Relative: 0 %
HCT: 49.5 % (ref 39.0–52.0)
Hemoglobin: 17.6 g/dL — ABNORMAL HIGH (ref 13.0–17.0)
Immature Granulocytes: 1 %
Lymphocytes Relative: 11 %
Lymphs Abs: 1.7 10*3/uL (ref 0.7–4.0)
MCH: 30.2 pg (ref 26.0–34.0)
MCHC: 35.6 g/dL (ref 30.0–36.0)
MCV: 84.9 fL (ref 80.0–100.0)
Monocytes Absolute: 1.4 10*3/uL — ABNORMAL HIGH (ref 0.1–1.0)
Monocytes Relative: 9 %
Neutro Abs: 12.3 10*3/uL — ABNORMAL HIGH (ref 1.7–7.7)
Neutrophils Relative %: 79 %
Platelets: 269 10*3/uL (ref 150–400)
RBC: 5.83 MIL/uL — ABNORMAL HIGH (ref 4.22–5.81)
RDW: 12.9 % (ref 11.5–15.5)
WBC: 15.6 10*3/uL — ABNORMAL HIGH (ref 4.0–10.5)
nRBC: 0 % (ref 0.0–0.2)

## 2021-05-07 LAB — URINALYSIS, MICROSCOPIC (REFLEX): Bacteria, UA: NONE SEEN

## 2021-05-07 LAB — COMPREHENSIVE METABOLIC PANEL
ALT: 24 U/L (ref 0–44)
AST: 23 U/L (ref 15–41)
Albumin: 4.2 g/dL (ref 3.5–5.0)
Alkaline Phosphatase: 96 U/L (ref 38–126)
Anion gap: 14 (ref 5–15)
BUN: 20 mg/dL (ref 6–20)
CO2: 27 mmol/L (ref 22–32)
Calcium: 10.2 mg/dL (ref 8.9–10.3)
Chloride: 91 mmol/L — ABNORMAL LOW (ref 98–111)
Creatinine, Ser: 1.06 mg/dL (ref 0.61–1.24)
GFR, Estimated: >60 mL/min (ref >60)
Glucose, Bld: 121 mg/dL — ABNORMAL HIGH (ref 70–99)
Potassium: 3.5 mmol/L (ref 3.5–5.1)
Sodium: 132 mmol/L — ABNORMAL LOW (ref 135–145)
Total Bilirubin: 3.3 mg/dL — ABNORMAL HIGH (ref 0.3–1.2)
Total Protein: 7.7 g/dL (ref 6.5–8.1)

## 2021-05-07 LAB — TYPE AND SCREEN
ABO/RH(D): O POS
Antibody Screen: NEGATIVE

## 2021-05-07 LAB — ABO/RH: ABO/RH(D): O POS

## 2021-05-07 LAB — URINALYSIS, ROUTINE W REFLEX MICROSCOPIC
Glucose, UA: NEGATIVE mg/dL
Ketones, ur: 40 mg/dL — AB
Leukocytes,Ua: NEGATIVE
Nitrite: NEGATIVE
Protein, ur: 30 mg/dL — AB
Specific Gravity, Urine: 1.02 (ref 1.005–1.030)
pH: 7 (ref 5.0–8.0)

## 2021-05-07 LAB — RESP PANEL BY RT-PCR (FLU A&B, COVID) ARPGX2
Influenza A by PCR: NEGATIVE
Influenza B by PCR: NEGATIVE
SARS Coronavirus 2 by RT PCR: NEGATIVE

## 2021-05-07 LAB — LIPASE, BLOOD: Lipase: 30 U/L (ref 11–51)

## 2021-05-07 SURGERY — LAPAROTOMY, EXPLORATORY
Anesthesia: General | Site: Abdomen

## 2021-05-07 MED ORDER — PROMETHAZINE HCL 25 MG/ML IJ SOLN: 6.2500 mg | INTRAMUSCULAR | Status: DC | PRN

## 2021-05-07 MED ORDER — DIPHENHYDRAMINE HCL 50 MG/ML IJ SOLN: 12.5000 mg | Freq: Four times a day (QID) | INTRAMUSCULAR | Status: DC | PRN

## 2021-05-07 MED ORDER — ONDANSETRON HCL 4 MG/2ML IJ SOLN
4.0000 mg | Freq: Once | INTRAMUSCULAR | Status: AC
  Administered 2021-05-07: 4 mg via INTRAVENOUS
  Filled 2021-05-07: qty 2

## 2021-05-07 MED ORDER — ENSURE SURGERY PO LIQD
237.0000 mL | Freq: Two times a day (BID) | ORAL | Status: DC
  Filled 2021-05-07 (×2): qty 237

## 2021-05-07 MED ORDER — SUGAMMADEX SODIUM 200 MG/2ML IV SOLN
INTRAVENOUS | Status: DC | PRN
  Administered 2021-05-07: 200 mg via INTRAVENOUS

## 2021-05-07 MED ORDER — MORPHINE SULFATE 1 MG/ML IV SOLN PCA
INTRAVENOUS | Status: DC
  Administered 2021-05-08: 10.5 mg via INTRAVENOUS
  Administered 2021-05-08: 4.5 mg via INTRAVENOUS
  Administered 2021-05-08: 13.5 mg via INTRAVENOUS
  Filled 2021-05-07 (×3): qty 30

## 2021-05-07 MED ORDER — ONDANSETRON HCL 4 MG/2ML IJ SOLN: 4.0000 mg | Freq: Four times a day (QID) | INTRAMUSCULAR | Status: DC | PRN

## 2021-05-07 MED ORDER — METRONIDAZOLE 500 MG/100ML IV SOLN
500.0000 mg | Freq: Once | INTRAVENOUS | Status: AC
  Administered 2021-05-07: 500 mg via INTRAVENOUS
  Filled 2021-05-07: qty 100

## 2021-05-07 MED ORDER — HYDRALAZINE HCL 20 MG/ML IJ SOLN
10.0000 mg | Freq: Once | INTRAMUSCULAR | Status: AC
  Administered 2021-05-07: 10 mg via INTRAVENOUS

## 2021-05-07 MED ORDER — METOPROLOL TARTRATE 5 MG/5ML IV SOLN
5.0000 mg | Freq: Four times a day (QID) | INTRAVENOUS | Status: DC | PRN
Start: 2021-05-07 — End: 2021-05-14

## 2021-05-07 MED ORDER — 0.9 % SODIUM CHLORIDE (POUR BTL) OPTIME
TOPICAL | Status: DC | PRN
  Administered 2021-05-07: 7000 mL

## 2021-05-07 MED ORDER — FENTANYL CITRATE (PF) 250 MCG/5ML IJ SOLN
INTRAMUSCULAR | Status: AC
  Filled 2021-05-07: qty 5

## 2021-05-07 MED ORDER — OXYCODONE-ACETAMINOPHEN 5-325 MG PO TABS
1.0000 | ORAL_TABLET | Freq: Once | ORAL | Status: AC
  Administered 2021-05-07: 1 via ORAL
  Filled 2021-05-07: qty 1

## 2021-05-07 MED ORDER — SODIUM CHLORIDE 0.9 % IV SOLN
2.0000 g | INTRAVENOUS | Status: AC
  Administered 2021-05-08 – 2021-05-12 (×5): 2 g via INTRAVENOUS
  Filled 2021-05-07 (×6): qty 20

## 2021-05-07 MED ORDER — ACETAMINOPHEN 10 MG/ML IV SOLN
1000.0000 mg | Freq: Four times a day (QID) | INTRAVENOUS | Status: AC
  Administered 2021-05-08 (×4): 1000 mg via INTRAVENOUS
  Filled 2021-05-07 (×4): qty 100

## 2021-05-07 MED ORDER — ROCURONIUM BROMIDE 100 MG/10ML IV SOLN
INTRAVENOUS | Status: DC | PRN
  Administered 2021-05-07: 50 mg via INTRAVENOUS

## 2021-05-07 MED ORDER — SODIUM CHLORIDE 0.9% FLUSH: 9.0000 mL | INTRAVENOUS | Status: DC | PRN

## 2021-05-07 MED ORDER — DEXAMETHASONE SODIUM PHOSPHATE 10 MG/ML IJ SOLN
INTRAMUSCULAR | Status: DC | PRN
  Administered 2021-05-07: 4 mg via INTRAVENOUS

## 2021-05-07 MED ORDER — SODIUM CHLORIDE 0.9 % IV BOLUS
1000.0000 mL | Freq: Once | INTRAVENOUS | Status: AC
  Administered 2021-05-07: 1000 mL via INTRAVENOUS

## 2021-05-07 MED ORDER — DIPHENHYDRAMINE HCL 12.5 MG/5ML PO ELIX: 12.5000 mg | ORAL_SOLUTION | Freq: Four times a day (QID) | ORAL | Status: DC | PRN

## 2021-05-07 MED ORDER — CHLORHEXIDINE GLUCONATE 0.12 % MT SOLN: 15.0000 mL | Freq: Once | OROMUCOSAL | Status: DC

## 2021-05-07 MED ORDER — KCL IN DEXTROSE-NACL 20-5-0.45 MEQ/L-%-% IV SOLN
INTRAVENOUS | Status: DC
  Filled 2021-05-07 (×15): qty 1000

## 2021-05-07 MED ORDER — ORAL CARE MOUTH RINSE: 15.0000 mL | Freq: Once | OROMUCOSAL | Status: DC

## 2021-05-07 MED ORDER — CHLORHEXIDINE GLUCONATE 0.12 % MT SOLN
OROMUCOSAL | Status: AC
  Filled 2021-05-07: qty 15

## 2021-05-07 MED ORDER — PROCHLORPERAZINE MALEATE 10 MG PO TABS
10.0000 mg | ORAL_TABLET | Freq: Four times a day (QID) | ORAL | Status: DC | PRN
  Filled 2021-05-07: qty 1

## 2021-05-07 MED ORDER — FENTANYL CITRATE (PF) 100 MCG/2ML IJ SOLN
25.0000 ug | INTRAMUSCULAR | Status: DC | PRN
  Administered 2021-05-07 (×2): 50 ug via INTRAVENOUS

## 2021-05-07 MED ORDER — MIDAZOLAM HCL 2 MG/2ML IJ SOLN
INTRAMUSCULAR | Status: AC
  Filled 2021-05-07: qty 2

## 2021-05-07 MED ORDER — PROCHLORPERAZINE EDISYLATE 10 MG/2ML IJ SOLN
5.0000 mg | Freq: Four times a day (QID) | INTRAMUSCULAR | Status: DC | PRN
  Filled 2021-05-07: qty 2

## 2021-05-07 MED ORDER — PROPOFOL 10 MG/ML IV BOLUS
INTRAVENOUS | Status: AC
  Filled 2021-05-07: qty 20

## 2021-05-07 MED ORDER — ONDANSETRON HCL 4 MG PO TABS: 4.0000 mg | ORAL_TABLET | Freq: Four times a day (QID) | ORAL | Status: DC | PRN

## 2021-05-07 MED ORDER — NALOXONE HCL 0.4 MG/ML IJ SOLN: 0.4000 mg | INTRAMUSCULAR | Status: DC | PRN

## 2021-05-07 MED ORDER — ONDANSETRON HCL 4 MG/2ML IJ SOLN
INTRAMUSCULAR | Status: DC | PRN
  Administered 2021-05-07: 4 mg via INTRAVENOUS

## 2021-05-07 MED ORDER — FENTANYL CITRATE (PF) 100 MCG/2ML IJ SOLN
INTRAMUSCULAR | Status: AC
  Filled 2021-05-07: qty 2

## 2021-05-07 MED ORDER — METRONIDAZOLE 500 MG/100ML IV SOLN
500.0000 mg | Freq: Two times a day (BID) | INTRAVENOUS | Status: AC
  Administered 2021-05-08 – 2021-05-12 (×10): 500 mg via INTRAVENOUS
  Filled 2021-05-07 (×10): qty 100

## 2021-05-07 MED ORDER — ENOXAPARIN SODIUM 40 MG/0.4ML IJ SOSY
40.0000 mg | PREFILLED_SYRINGE | INTRAMUSCULAR | Status: DC
  Administered 2021-05-08 – 2021-05-13 (×6): 40 mg via SUBCUTANEOUS
  Filled 2021-05-07 (×6): qty 0.4

## 2021-05-07 MED ORDER — PROPOFOL 10 MG/ML IV BOLUS
INTRAVENOUS | Status: DC | PRN
  Administered 2021-05-07: 150 mg via INTRAVENOUS

## 2021-05-07 MED ORDER — METHOCARBAMOL 1000 MG/10ML IJ SOLN
500.0000 mg | Freq: Three times a day (TID) | INTRAVENOUS | Status: DC | PRN
  Filled 2021-05-07: qty 5

## 2021-05-07 MED ORDER — FENTANYL CITRATE (PF) 250 MCG/5ML IJ SOLN
INTRAMUSCULAR | Status: DC | PRN
  Administered 2021-05-07: 100 ug via INTRAVENOUS
  Administered 2021-05-07: 50 ug via INTRAVENOUS
  Administered 2021-05-07: 150 ug via INTRAVENOUS
  Administered 2021-05-07: 50 ug via INTRAVENOUS

## 2021-05-07 MED ORDER — IOHEXOL 350 MG/ML SOLN
80.0000 mL | Freq: Once | INTRAVENOUS | Status: AC | PRN
  Administered 2021-05-07: 80 mL via INTRAVENOUS

## 2021-05-07 MED ORDER — LIDOCAINE 2% (20 MG/ML) 5 ML SYRINGE
INTRAMUSCULAR | Status: DC | PRN
  Administered 2021-05-07: 80 mg via INTRAVENOUS

## 2021-05-07 MED ORDER — HYDRALAZINE HCL 20 MG/ML IJ SOLN
INTRAMUSCULAR | Status: AC
  Filled 2021-05-07: qty 1

## 2021-05-07 MED ORDER — LACTATED RINGERS IV SOLN: INTRAVENOUS | Status: DC

## 2021-05-07 MED ORDER — LABETALOL HCL 5 MG/ML IV SOLN
INTRAVENOUS | Status: AC
  Filled 2021-05-07: qty 4

## 2021-05-07 MED ORDER — LABETALOL HCL 5 MG/ML IV SOLN
10.0000 mg | Freq: Once | INTRAVENOUS | Status: AC
  Administered 2021-05-07: 10 mg via INTRAVENOUS

## 2021-05-07 MED ORDER — LACTATED RINGERS IV SOLN: INTRAVENOUS | Status: DC | PRN

## 2021-05-07 MED ORDER — ACETAMINOPHEN 10 MG/ML IV SOLN
INTRAVENOUS | Status: AC
  Filled 2021-05-07: qty 100

## 2021-05-07 MED ORDER — HYDROMORPHONE HCL 1 MG/ML IJ SOLN
1.0000 mg | Freq: Once | INTRAMUSCULAR | Status: AC | PRN
  Administered 2021-05-07: 1 mg via INTRAVENOUS
  Filled 2021-05-07: qty 1

## 2021-05-07 MED ORDER — HYDROMORPHONE HCL 1 MG/ML IJ SOLN
1.0000 mg | Freq: Once | INTRAMUSCULAR | Status: AC
  Administered 2021-05-07: 1 mg via INTRAVENOUS
  Filled 2021-05-07: qty 1

## 2021-05-07 MED ORDER — SODIUM CHLORIDE 0.9 % IV SOLN
2.0000 g | Freq: Once | INTRAVENOUS | Status: AC
  Administered 2021-05-07: 2 g via INTRAVENOUS
  Filled 2021-05-07: qty 2

## 2021-05-07 MED ORDER — SUCCINYLCHOLINE CHLORIDE 200 MG/10ML IV SOSY
PREFILLED_SYRINGE | INTRAVENOUS | Status: DC | PRN
  Administered 2021-05-07: 100 mg via INTRAVENOUS

## 2021-05-07 MED ORDER — ACETAMINOPHEN 10 MG/ML IV SOLN
INTRAVENOUS | Status: DC | PRN
  Administered 2021-05-07: 1000 mg via INTRAVENOUS

## 2021-05-07 MED ORDER — LABETALOL HCL 5 MG/ML IV SOLN
INTRAVENOUS | Status: DC | PRN
  Administered 2021-05-07 (×2): 5 mg via INTRAVENOUS

## 2021-05-07 SURGICAL SUPPLY — 43 items
BAG COUNTER SPONGE SURGICOUNT (BAG) ×2 IMPLANT
BAG SURGICOUNT SPONGE COUNTING (BAG) ×1
BLADE CLIPPER SURG (BLADE) ×3 IMPLANT
CANISTER SUCT 3000ML PPV (MISCELLANEOUS) ×3 IMPLANT
CHLORAPREP W/TINT 26 (MISCELLANEOUS) ×3 IMPLANT
COVER SURGICAL LIGHT HANDLE (MISCELLANEOUS) ×3 IMPLANT
DRAPE LAPAROSCOPIC ABDOMINAL (DRAPES) ×3 IMPLANT
DRAPE WARM FLUID 44X44 (DRAPES) ×3 IMPLANT
DRSG COVADERM 4X10 (GAUZE/BANDAGES/DRESSINGS) ×3 IMPLANT
DRSG OPSITE POSTOP 4X10 (GAUZE/BANDAGES/DRESSINGS) IMPLANT
DRSG OPSITE POSTOP 4X8 (GAUZE/BANDAGES/DRESSINGS) IMPLANT
ELECT BLADE 6.5 EXT (BLADE) IMPLANT
ELECT CAUTERY BLADE 6.4 (BLADE) ×3 IMPLANT
ELECT REM PT RETURN 9FT ADLT (ELECTROSURGICAL) ×3
ELECTRODE REM PT RTRN 9FT ADLT (ELECTROSURGICAL) ×1 IMPLANT
GLOVE SURG ENC MOIS LTX SZ6 (GLOVE) ×3 IMPLANT
GLOVE SURG UNDER LTX SZ6.5 (GLOVE) ×3 IMPLANT
GOWN STRL REUS W/ TWL LRG LVL3 (GOWN DISPOSABLE) ×1 IMPLANT
GOWN STRL REUS W/TWL 2XL LVL3 (GOWN DISPOSABLE) ×3 IMPLANT
GOWN STRL REUS W/TWL LRG LVL3 (GOWN DISPOSABLE) ×3
HANDLE SUCTION POOLE (INSTRUMENTS) ×1 IMPLANT
KIT BASIN OR (CUSTOM PROCEDURE TRAY) ×3 IMPLANT
KIT TURNOVER KIT B (KITS) ×3 IMPLANT
LIGASURE IMPACT 36 18CM CVD LR (INSTRUMENTS) ×3 IMPLANT
NS IRRIG 1000ML POUR BTL (IV SOLUTION) ×21 IMPLANT
PACK GENERAL/GYN (CUSTOM PROCEDURE TRAY) ×3 IMPLANT
PAD ARMBOARD 7.5X6 YLW CONV (MISCELLANEOUS) ×3 IMPLANT
PENCIL SMOKE EVACUATOR (MISCELLANEOUS) ×3 IMPLANT
SPECIMEN JAR LARGE (MISCELLANEOUS) IMPLANT
SPONGE T-LAP 18X18 ~~LOC~~+RFID (SPONGE) ×9 IMPLANT
STAPLER VISISTAT 35W (STAPLE) ×3 IMPLANT
SUCTION POOLE HANDLE (INSTRUMENTS) ×3
SUT PDS AB 1 TP1 96 (SUTURE) ×6 IMPLANT
SUT PDS II 0 TP-1 LOOPED 60 (SUTURE) ×6 IMPLANT
SUT VIC AB 2-0 SH 18 (SUTURE) ×3 IMPLANT
SUT VIC AB 3-0 SH 18 (SUTURE) ×3 IMPLANT
SUT VICRYL 4-0 PS2 18IN ABS (SUTURE) IMPLANT
SUT VICRYL AB 2 0 TIES (SUTURE) ×3 IMPLANT
SUT VICRYL AB 3 0 TIES (SUTURE) ×3 IMPLANT
TOWEL GREEN STERILE (TOWEL DISPOSABLE) ×3 IMPLANT
TOWEL GREEN STERILE FF (TOWEL DISPOSABLE) ×3 IMPLANT
TRAY FOLEY MTR SLVR 16FR STAT (SET/KITS/TRAYS/PACK) ×3 IMPLANT
YANKAUER SUCT BULB TIP NO VENT (SUCTIONS) ×3 IMPLANT

## 2021-05-07 NOTE — H&P (Signed)
Jeffery [name] is an 55 y.o. male.   Chief Complaint: perforated ulcer HPI:  Patient is a 55 year old male who came to the emergency department this morning with 4 days of worsening abdominal pain.  He describes the pain as starting out feeling like heart attack.  The pain then migrated lower in his abdomen.  He now has pain all over his abdomen.  He does not recall an incidence where the pain suddenly got worse, but it has been unbearable now for over 24 hours.  He has tried over-the-counter pain medication and Tums to no avail.  He denies any history of ulcers of which he is aware.  He has never had an endoscopy.  He denies taking any significant quantities of BC powders, Aleve, ibuprofen, or other anti-inflammatory medications.  He works in [employer].  He has never had pain like this before.  He thought it might be kidney stones as he has had pain in the past.  Although this was not exactly the same, that was the first thought.  Initial CT renal protocol was not revealing.  He continued to have ear pain so a CT angiogram was performed to evaluate for dissection.  The CT then showed a perforated pyloric ulcer.  Past Medical History:  Diagnosis Date   COPD (chronic obstructive pulmonary disease) (HCC)     History reviewed. No pertinent surgical history.  History reviewed. No pertinent family history. Social History:  reports that he has quit smoking. He has never used smokeless tobacco. He reports that he does not currently use alcohol. He reports that he does not currently use drugs.  Allergies:  Allergies  Allergen Reactions   Amoxicillin Rash    Has patient had a PCN reaction causing immediate rash, facial/tongue/throat swelling, SOB or lightheadedness with hypotension: Yes Has patient had a PCN reaction causing severe rash involving mucus membranes or skin necrosis: No Has patient had a PCN reaction that required hospitalization: No Has patient had a PCN reaction occurring within the  last 10 years: No If all of the above answers are "NO", then may proceed with Cephalosporin use.    Current Meds  Medication Sig   acetaminophen (TYLENOL) 500 MG tablet Take 500-1,000 mg by mouth every 6 (six) hours as needed for mild pain or headache.   aspirin EC 81 MG tablet Take 81-162 mg by mouth daily as needed for mild pain (or headaches). Swallow whole.     Results for orders placed or performed during the hospital encounter of 05/07/21 (from the past 48 hour(s))  Urinalysis, Routine w reflex microscopic     Status: Abnormal   Collection Time: 05/07/21  8:33 AM  Result Value Ref Range   Color, Urine YELLOW YELLOW   APPearance CLEAR CLEAR   Specific Gravity, Urine 1.020 1.005 - 1.030   pH 7.0 5.0 - 8.0   Glucose, UA NEGATIVE NEGATIVE mg/dL   Hgb urine dipstick TRACE (A) NEGATIVE   Bilirubin Urine SMALL (A) NEGATIVE   Ketones, ur 40 (A) NEGATIVE mg/dL   Protein, ur 30 (A) NEGATIVE mg/dL   Nitrite NEGATIVE NEGATIVE   Leukocytes,Ua NEGATIVE NEGATIVE    Comment: Performed at [facility], [address], [place], [place] [address]  Urinalysis, Microscopic (reflex)     Status: None   Collection Time: 05/07/21  8:33 AM  Result Value Ref Range   RBC / HPF 0-5 0 - 5 RBC/hpf   WBC, UA 0-5 0 - 5 WBC/hpf   Bacteria, UA  NONE SEEN NONE SEEN   Squamous Epithelial / LPF 0-5 0 - 5   Mucus PRESENT     Comment: Performed at [facility], [address], [place], [place] [address]  CBC with Differential     Status: Abnormal   Collection Time: 05/07/21  8:52 AM  Result Value Ref Range   WBC 15.6 (H) 4.0 - 10.5 K/uL   RBC 5.83 (H) 4.22 - 5.81 MIL/uL   Hemoglobin 17.6 (H) 13.0 - 17.0 g/dL   HCT 60.4 54.0 - 98.1 %   MCV 84.9 80.0 - 100.0 fL   MCH 30.2 26.0 - 34.0 pg   MCHC 35.6 30.0 - 36.0 g/dL   RDW 19.1 47.8 - 29.5 %   Platelets 269 150 - 400 K/uL   nRBC 0.0 0.0 - 0.2 %   Neutrophils Relative % 79 %   Neutro Abs 12.3 (H) 1.7 - 7.7 K/uL   Lymphocytes Relative 11 %    Lymphs Abs 1.7 0.7 - 4.0 K/uL   Monocytes Relative 9 %   Monocytes Absolute 1.4 (H) 0.1 - 1.0 K/uL   Eosinophils Relative 0 %   Eosinophils Absolute 0.0 0.0 - 0.5 K/uL   Basophils Relative 0 %   Basophils Absolute 0.1 0.0 - 0.1 K/uL   Immature Granulocytes 1 %   Abs Immature Granulocytes 0.09 (H) 0.00 - 0.07 K/uL    Comment: Performed at [facility], [address] [place], [place] [address]  Comprehensive metabolic panel     Status: Abnormal   Collection Time: 05/07/21  8:52 AM  Result Value Ref Range   Sodium 132 (L) 135 - 145 mmol/L   Potassium 3.5 3.5 - 5.1 mmol/L   Chloride 91 (L) 98 - 111 mmol/L   CO2 27 22 - 32 mmol/L   Glucose, Bld 121 (H) 70 - 99 mg/dL    Comment: Glucose reference range applies only to samples taken after fasting for at least 8 hours.   BUN 20 6 - 20 mg/dL   Creatinine, Ser 8.65 0.61 - 1.24 mg/dL   Calcium 78.4 8.9 - 69.6 mg/dL   Total Protein 7.7 6.5 - 8.1 g/dL   Albumin 4.2 3.5 - 5.0 g/dL   AST 23 15 - 41 U/L   ALT 24 0 - 44 U/L   Alkaline Phosphatase 96 38 - 126 U/L   Total Bilirubin 3.3 (H) 0.3 - 1.2 mg/dL   GFR, Estimated >29 >52 mL/min    Comment: (NOTE) Calculated using the CKD-EPI Creatinine Equation (2021)    Anion gap 14 5 - 15    Comment: Performed at [facility], [address] [place], [place] [address]  Lipase, blood     Status: None   Collection Time: 05/07/21  8:52 AM  Result Value Ref Range   Lipase 30 11 - 51 U/L    Comment: Performed at [facility], [address] [place], [place] [address]  ABO/Rh     Status: None   Collection Time: 05/07/21  8:52 AM  Result Value Ref Range   ABO/RH(D)      O POS Performed at [facility], [address] [place], [place] [address]   Type and screen [facility]     Status: None   Collection Time: 05/07/21  6:00 PM  Result Value Ref Range   ABO/RH(D) O POS    Antibody Screen NEG    Sample Expiration  05/10/2021,2359 Performed at [facility], [address], [place], [place] [address]   Resp Panel by RT-PCR (Flu A&B, Covid) Nasopharyngeal Swab     Status: None   Collection Time: 05/07/21  6:00 PM   Specimen: Nasopharyngeal Swab; Nasopharyngeal(NP) swabs in vial transport medium  Result Value Ref Range   SARS Coronavirus 2 by RT PCR NEGATIVE NEGATIVE    Comment: (NOTE) SARS-CoV-2 target nucleic acids are NOT DETECTED.  The SARS-CoV-2 RNA is generally detectable in upper respiratory specimens during the acute phase of infection. The lowest concentration of SARS-CoV-2 viral copies this assay can detect is 138 copies/mL. A negative result does not preclude SARS-Cov-2 infection and should not be used as the sole basis for treatment or other patient management decisions. A negative result may occur with  improper specimen collection/handling, submission of specimen other than nasopharyngeal swab, presence of viral mutation(s) within the areas targeted by this assay, and inadequate number of viral copies(<138 copies/mL). A negative result must be combined with clinical observations, patient history, and epidemiological information. The expected result is Negative.  Fact Sheet for Patients:  BloggerCourse.com  Fact Sheet for Healthcare Providers:  SeriousBroker.it  This test is no t yet approved or cleared by the [place] FDA and  has been authorized for detection and/or diagnosis of SARS-CoV-2 by FDA under an Emergency Use Authorization (EUA). This EUA will remain  in effect (meaning this test can be used) for the duration of the COVID-19 declaration under Section 564(b)(1) of the Act, 21 U.S.C.section 360bbb-3(b)(1), unless the authorization is terminated  or revoked sooner.       Influenza A by PCR NEGATIVE NEGATIVE   Influenza B by PCR NEGATIVE NEGATIVE    Comment: (NOTE) The Xpert Xpress SARS-CoV-2/FLU/RSV plus assay is intended as an aid in the  diagnosis of influenza from Nasopharyngeal swab specimens and should not be used as a sole basis for treatment. Nasal washings and aspirates are unacceptable for Xpert Xpress SARS-CoV-2/FLU/RSV testing.  Fact Sheet for Patients: BloggerCourse.com  Fact Sheet for Healthcare Providers: SeriousBroker.it  This test is not yet approved or cleared by the [place] FDA and has been authorized for detection and/or diagnosis of SARS-CoV-2 by FDA under an Emergency Use Authorization (EUA). This EUA will remain in effect (meaning this test can be used) for the duration of the COVID-19 declaration under Section 564(b)(1) of the Act, 21 U.S.C. section 360bbb-3(b)(1), unless the authorization is terminated or revoked.  Performed at [facility], [address] [place], [place] [address]    CT Renal Stone Study  Result Date: 05/07/2021 CLINICAL DATA:  Flank pain, kidney stone suspected EXAM: CT ABDOMEN AND PELVIS WITHOUT CONTRAST TECHNIQUE: Multidetector CT imaging of the abdomen and pelvis was performed following the standard protocol without IV contrast. COMPARISON:  CT February 01, 2018. FINDINGS: Lower chest: Mild chronic left basilar scarring. Otherwise, visualized lung bases are clear. Hepatobiliary: Small hypodensities within the liver, similar to prior likely cysts. These lesions were better characterized on prior with contrast study. Unremarkable gallbladder. Pancreas: Unremarkable. No pancreatic ductal dilatation or surrounding inflammatory changes. Spleen: Normal in size without focal abnormality. Adrenals/Urinary Tract: Adrenal glands are unremarkable. Kidneys are normal, without renal calculi, focal lesion, or hydronephrosis. Bladder is decompressed, limiting evaluation. Stomach/Bowel: Stomach is within normal limits. Appendix is not confidently identified; however, no right lower quadrant inflammatory changes to suggest appendicitis. No  evidence of bowel wall thickening, distention, or inflammatory changes. Vascular/Lymphatic: Aortic atherosclerosis. No enlarged abdominal  or pelvic lymph nodes. Reproductive: Chronic prostatic calcifications.  Prostatomegaly. Other: No abdominal wall hernia or abnormality. No abdominopelvic ascites. Musculoskeletal: Mild degenerative changes in the lumbar spine with degenerative endplate Schmorl's nodes and osteophytes. No evidence of acute fracture. IMPRESSION: 1. No acute findings in the abdomen or pelvis. 2. Prostatomegaly with chronic prostate calcifications. Electronically Signed   By: Feliberto Harts M.D.   On: 05/07/2021 10:40   CT Angio Chest/Abd/Pel for Dissection W and/or W/WO  Result Date: 05/07/2021 CLINICAL DATA:  Chest and back pain EXAM: CT ANGIOGRAPHY CHEST, ABDOMEN AND PELVIS TECHNIQUE: Non-contrast CT of the chest was initially obtained. Multidetector CT imaging through the chest, abdomen and pelvis was performed using the standard protocol during bolus administration of intravenous contrast. Multiplanar reconstructed images and MIPs were obtained and reviewed to evaluate the vascular anatomy. CONTRAST:  76mL OMNIPAQUE IOHEXOL 350 MG/ML SOLN COMPARISON:  09/09/2020 FINDINGS: CTA CHEST FINDINGS Cardiovascular: Preferential opacification of the thoracic aorta. Normal contour and caliber of the thoracic aorta. No evidence of aneurysm, dissection, or other acute aortic pathology. Minimal atherosclerosis. Normal heart size. No pericardial effusion. Mediastinum/Nodes: No enlarged mediastinal, hilar, or axillary lymph nodes. Thyroid gland, trachea, and esophagus demonstrate no significant findings. Lungs/Pleura: Minimal centrilobular and paraseptal emphysema. Background of very fine centrilobular pulmonary nodularity, most concentrated in the lung apices. Diffuse bilateral bronchial wall thickening. No pleural effusion or pneumothorax. Musculoskeletal: No chest wall abnormality. No acute or  significant osseous findings. Review of the MIP images confirms the above findings. CTA ABDOMEN AND PELVIS FINDINGS VASCULAR Normal contour and caliber of the abdominal aorta. Evidence of aneurysm, dissection, or other acute aortic pathology. Standard branching pattern of the abdominal aorta, with solitary bilateral renal arteries. The branch vessel origins are patent. Mild mixed atherosclerosis. 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 NON-VASCULAR Hepatobiliary: No solid liver abnormality is seen. No gallstones, gallbladder wall thickening, or biliary dilatation. Pancreas: Unremarkable. No pancreatic ductal dilatation or surrounding inflammatory changes. Spleen: Normal in size without significant abnormality. Adrenals/Urinary Tract: Adrenal glands are unremarkable. Kidneys are normal, without renal calculi, solid lesion, or hydronephrosis. Bladder is unremarkable. Stomach/Bowel: There is a perforated ulceration of the posterior pylorus with adjacent inflammatory fat stranding and fluid (series 5, image 167). Appendix appears normal. No evidence of bowel wall thickening, distention, or inflammatory changes. Lymphatic: No enlarged abdominal or pelvic lymph nodes. Reproductive: No mass or other significant abnormality. Other: No abdominal wall hernia or abnormality. Pneumoperitoneum within the lesser and greater omental sacs. Musculoskeletal: No acute or significant osseous findings. Review of the MIP images confirms the above findings. IMPRESSION: 1. There is a perforated ulceration of the posterior pylorus with adjacent inflammatory fat stranding and fluid. Associated pneumoperitoneum. 2. No evidence of thoracic or abdominal aortic aneurysm, dissection, or other acute aortic pathology. Mild atherosclerosis. 3. 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 4. Minimal emphysema and diffuse bilateral bronchial wall thickening. 5. Background of very fine centrilobular pulmonary nodularity, most concentrated in the lung apices, most commonly seen in smoking-related respiratory bronchiolitis. Aortic Atherosclerosis (ICD10-I70.0) and Emphysema (ICD10-J43.9). Electronically Signed   By: Lauralyn Primes M.D.   On: 05/07/2021 13:44    Review of Systems  Constitutional: Negative.   HENT: Negative.    Eyes: Negative.   Respiratory: Negative.  Cardiovascular: Negative.   Gastrointestinal:  Positive for abdominal pain and nausea.  Endocrine: Negative.   Genitourinary: Negative.   Musculoskeletal: Negative.   Skin: Negative.   Allergic/Immunologic: Negative.   Neurological: Negative.   Hematological: Negative.   Psychiatric/Behavioral: Negative.    All other systems reviewed and are negative.   Blood pressure (!) 192/120, pulse 75, temperature 98 F (36.7 C), temperature source Oral, resp. rate (!) 24, height 5\' 9"  (1.753 m), weight 65.8 kg, SpO2 100 %. Physical Exam Vitals reviewed.  Constitutional:      Appearance: He is well-developed. He is ill-appearing. He is not toxic-appearing or diaphoretic.     Comments: thin  HENT:     Head: Normocephalic and atraumatic.     Mouth/Throat:     Comments: dry Eyes:     General: No scleral icterus.    Extraocular Movements: Extraocular movements intact.     Pupils: Pupils are equal, round, and reactive to light.  Cardiovascular:     Rate and Rhythm: Normal rate and regular rhythm.  Pulmonary:     Effort: Pulmonary effort is normal. No respiratory distress.  Chest:     Chest wall: No tenderness.  Abdominal:     General: Abdomen is flat and scaphoid. There is no distension.     Palpations: Abdomen is rigid. There is no fluid wave, mass or pulsatile mass.     Tenderness: There is  generalized abdominal tenderness.  Skin:    General: Skin is warm and dry.     Capillary Refill: Capillary refill takes 2 to 3 seconds.     Coloration: Skin is not cyanotic, jaundiced, mottled or pale.     Findings: No erythema or rash.  Neurological:     General: No focal deficit present.     Mental Status: He is alert and oriented to person, place, and time.     Motor: No weakness.  Psychiatric:        Mood and Affect: Mood is anxious (in a normal way for someone facing major surgery). Mood is not depressed.        Behavior: Behavior normal.     Assessment/Plan Perforated ulcer  NPO IV fluids IV antibiotics To OR for ex lap and repair of perforated ulcer.  Discussed surgery with patient and significant other. Reviewed risks of surgery including bleeding, infection, possible need for additional surgeries or procedures, possible prolonged recovery, possible heart/lung issues, blood clot, and others.  Discussed time in hospital and recovery. He wishes to proceed.    , MD 05/07/2021, 7:06 PM

## 2021-05-07 NOTE — ED Provider Notes (Signed)
[facility] EMERGENCY DEPARTMENT Provider Note   CSN: [id number] Arrival date & time: 05/07/21  0827     History Chief Complaint  Patient presents with   Abdominal Pain    Jeffery [name] is a 55 y.o. male.  55 year old male with prior medical history as detailed below presents for evaluation.  Patient complains of persistent epigastric abdominal discomfort the last 4 days.  Pain was worse today.  He presented to the ED for evaluation.  He was seen by this provider after an 8-hour wait at approximately 1715 when he was placed in a ED hallway bed.  Initial CT imaging of his abdomen without contrast did not reveal significant acute pathology.  Patient's pain increased around noon.  Repeat imaging was obtained.  CT demonstrates evidence of perforated pyloric ulceration with associated pneumoperitoneum.    The history is provided by the patient.  Abdominal Pain Pain location:  Generalized Pain quality: aching   Pain radiates to:  Does not radiate Pain severity:  Severe Onset quality:  Sudden     Past Medical History:  Diagnosis Date   COPD (chronic obstructive pulmonary disease) (HCC)     There are no problems to display for this patient.   History reviewed. No pertinent surgical history.     History reviewed. No pertinent family history.  Social History   Tobacco Use   Smoking status: Former   Smokeless tobacco: Never  Substance Use Topics   Alcohol use: Not Currently   Drug use: Not Currently    Home Medications Prior to Admission medications   Medication Sig Start Date End Date Taking? Authorizing Provider  ibuprofen (ADVIL,MOTRIN) 200 MG tablet Take 400 mg by mouth every 6 (six) hours as needed for moderate pain.    [provider]  ondansetron (ZOFRAN ODT) 4 MG disintegrating tablet Take 1 tablet (4 mg total) by mouth every 8 (eight) hours as needed for nausea or vomiting. 02/01/18   McDonald, Mia A, PA-C  oxyCODONE-acetaminophen  (PERCOCET/ROXICET) 5-325 MG tablet Take 1 tablet by mouth every 4 (four) hours as needed for severe pain. 02/01/18   McDonald, Mia A, PA-C    Allergies    Amoxicillin  Review of Systems   Review of Systems  Gastrointestinal:  Positive for abdominal pain.  All other systems reviewed and are negative.  Physical Exam Updated Vital Signs BP (!) 111/59   Pulse 97   Temp 97.8 F (36.6 C) (Oral)   Resp 16   Ht 5\' 9"  (1.753 m)   Wt 65.8 kg   SpO2 100%   BMI 21.41 kg/m   Physical Exam Vitals and nursing note reviewed.  Constitutional:      General: He is in acute distress.     Appearance: Normal appearance. He is well-developed.  HENT:     Head: Normocephalic and atraumatic.  Eyes:     Conjunctiva/sclera: Conjunctivae normal.     Pupils: Pupils are equal, round, and reactive to light.  Cardiovascular:     Rate and Rhythm: Normal rate and regular rhythm.     Heart sounds: Normal heart sounds.  Pulmonary:     Effort: Pulmonary effort is normal. No respiratory distress.     Breath sounds: Normal breath sounds.  Abdominal:     General: There is no distension.     Palpations: Abdomen is soft.     Tenderness: There is generalized abdominal tenderness. There is guarding and rebound.  Musculoskeletal:  General: No deformity. Normal range of motion.     Cervical back: Normal range of motion and neck supple.  Skin:    General: Skin is warm and dry.  Neurological:     General: No focal deficit present.     Mental Status: He is alert and oriented to person, place, and time.    ED Results / Procedures / Treatments   Labs (all labs ordered are listed, but only abnormal results are displayed) Labs Reviewed  CBC WITH DIFFERENTIAL/PLATELET - Abnormal; Notable for the following components:      Result Value   WBC 15.6 (*)    RBC 5.83 (*)    Hemoglobin 17.6 (*)    Neutro Abs 12.3 (*)    Monocytes Absolute 1.4 (*)    Abs Immature Granulocytes 0.09 (*)    All other components  within normal limits  COMPREHENSIVE METABOLIC PANEL - Abnormal; Notable for the following components:   Sodium 132 (*)    Chloride 91 (*)    Glucose, Bld 121 (*)    Total Bilirubin 3.3 (*)    All other components within normal limits  URINALYSIS, ROUTINE W REFLEX MICROSCOPIC - Abnormal; Notable for the following components:   Hgb urine dipstick TRACE (*)    Bilirubin Urine SMALL (*)    Ketones, ur 40 (*)    Protein, ur 30 (*)    All other components within normal limits  RESP PANEL BY RT-PCR (FLU A&B, COVID) ARPGX2  LIPASE, BLOOD  URINALYSIS, MICROSCOPIC (REFLEX)  TYPE AND SCREEN    EKG None  Radiology CT Renal Stone Study  Result Date: 05/07/2021 CLINICAL DATA:  Flank pain, kidney stone suspected EXAM: CT ABDOMEN AND PELVIS WITHOUT CONTRAST TECHNIQUE: Multidetector CT imaging of the abdomen and pelvis was performed following the standard protocol without IV contrast. COMPARISON:  CT February 01, 2018. FINDINGS: Lower chest: Mild chronic left basilar scarring. Otherwise, visualized lung bases are clear. Hepatobiliary: Small hypodensities within the liver, similar to prior likely cysts. These lesions were better characterized on prior with contrast study. Unremarkable gallbladder. Pancreas: Unremarkable. No pancreatic ductal dilatation or surrounding inflammatory changes. Spleen: Normal in size without focal abnormality. Adrenals/Urinary Tract: Adrenal glands are unremarkable. Kidneys are normal, without renal calculi, focal lesion, or hydronephrosis. Bladder is decompressed, limiting evaluation. Stomach/Bowel: Stomach is within normal limits. Appendix is not confidently identified; however, no right lower quadrant inflammatory changes to suggest appendicitis. No evidence of bowel wall thickening, distention, or inflammatory changes. Vascular/Lymphatic: Aortic atherosclerosis. No enlarged abdominal or pelvic lymph nodes. Reproductive: Chronic prostatic calcifications.  Prostatomegaly. Other: No  abdominal wall hernia or abnormality. No abdominopelvic ascites. Musculoskeletal: Mild degenerative changes in the lumbar spine with degenerative endplate Schmorl's nodes and osteophytes. No evidence of acute fracture. IMPRESSION: 1. No acute findings in the abdomen or pelvis. 2. Prostatomegaly with chronic prostate calcifications. Electronically Signed   By: Feliberto Harts M.D.   On: 05/07/2021 10:40   CT Angio Chest/Abd/Pel for Dissection W and/or W/WO  Result Date: 05/07/2021 CLINICAL DATA:  Chest and back pain EXAM: CT ANGIOGRAPHY CHEST, ABDOMEN AND PELVIS TECHNIQUE: Non-contrast CT of the chest was initially obtained. Multidetector CT imaging through the chest, abdomen and pelvis was performed using the standard protocol during bolus administration of intravenous contrast. Multiplanar reconstructed images and MIPs were obtained and reviewed to evaluate the vascular anatomy. CONTRAST:  58mL OMNIPAQUE IOHEXOL 350 MG/ML SOLN COMPARISON:  09/09/2020 FINDINGS: CTA CHEST FINDINGS Cardiovascular: Preferential opacification of the thoracic aorta. Normal contour and caliber  of the thoracic aorta. No evidence of aneurysm, dissection, or other acute aortic pathology. Minimal atherosclerosis. Normal heart size. No pericardial effusion. Mediastinum/Nodes: No enlarged mediastinal, hilar, or axillary lymph nodes. Thyroid gland, trachea, and esophagus demonstrate no significant findings. Lungs/Pleura: Minimal centrilobular and paraseptal emphysema. Background of very fine centrilobular pulmonary nodularity, most concentrated in the lung apices. Diffuse bilateral bronchial wall thickening. No pleural effusion or pneumothorax. Musculoskeletal: No chest wall abnormality. No acute or significant osseous findings. Review of the MIP images confirms the above findings. CTA ABDOMEN AND PELVIS FINDINGS VASCULAR Normal contour and caliber of the abdominal aorta. Evidence of aneurysm, dissection, or other acute aortic pathology.  Standard branching pattern of the abdominal aorta, with solitary bilateral renal arteries. The branch vessel origins are patent. Mild mixed atherosclerosis. 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 NON-VASCULAR Hepatobiliary: No solid liver abnormality is seen. No gallstones, gallbladder wall thickening, or biliary dilatation. Pancreas: Unremarkable. No pancreatic ductal dilatation or surrounding inflammatory changes. Spleen: Normal in size without significant abnormality. Adrenals/Urinary Tract: Adrenal glands are unremarkable. Kidneys are normal, without renal calculi, solid lesion, or hydronephrosis. Bladder is unremarkable. Stomach/Bowel: There is a perforated ulceration of the posterior pylorus with adjacent inflammatory fat stranding and fluid (series 5, image 167). Appendix appears normal. No evidence of bowel wall thickening, distention, or inflammatory changes. Lymphatic: No enlarged abdominal or pelvic lymph nodes. Reproductive: No mass or other significant abnormality. Other: No abdominal wall hernia or abnormality. Pneumoperitoneum within the lesser and greater omental sacs. Musculoskeletal: No acute or significant osseous findings. Review of the MIP images confirms the above findings. IMPRESSION: 1. There is a perforated ulceration of the posterior pylorus with adjacent inflammatory fat stranding and fluid. Associated pneumoperitoneum. 2. No evidence of thoracic or abdominal aortic aneurysm, dissection, or other acute aortic pathology. Mild atherosclerosis. 3. 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 4. Minimal emphysema and diffuse bilateral bronchial wall thickening. 5. Background of very fine centrilobular pulmonary nodularity,  most concentrated in the lung apices, most commonly seen in smoking-related respiratory bronchiolitis. Aortic Atherosclerosis (ICD10-I70.0) and Emphysema (ICD10-J43.9). Electronically Signed   By: Lauralyn Primes M.D.   On: 05/07/2021 13:44    Procedures Procedures   Medications Ordered in ED Medications  HYDROmorphone (DILAUDID) injection 1 mg (has no administration in time range)  sodium chloride 0.9 % bolus 1,000 mL (has no administration in time range)  ondansetron (ZOFRAN) injection 4 mg (has no administration in time range)  ceFEPIme (MAXIPIME) 2 g in sodium chloride 0.9 % 100 mL IVPB (has no administration in time range)    And  metroNIDAZOLE (FLAGYL) IVPB 500 mg (has no administration in time range)  iohexol (OMNIPAQUE) 350 MG/ML injection 80 mL (80 mLs Intravenous Contrast Given 05/07/21 1313)  oxyCODONE-acetaminophen (PERCOCET/ROXICET) 5-325 MG per tablet 1 tablet (1 tablet Oral Given 05/07/21 1506)    ED Course  I have reviewed the triage vital signs and the nursing notes.  Pertinent labs & imaging results that were available during my care of the patient were reviewed by me and considered in my medical decision making (see chart for details).    MDM Rules/Calculators/A&P  CRITICAL CARE Performed by: Wynetta Fines   Total critical care time: 30 minutes  Critical care time was exclusive of separately billable procedures and treating other patients.  Critical care was necessary to treat or prevent imminent or life-threatening deterioration.  Critical care was time spent personally by me on the following activities: development of treatment plan with patient and/or surrogate as well as nursing, discussions with consultants, evaluation of patient's response to treatment, examination of patient, obtaining history from patient or surrogate, ordering and performing treatments and interventions, ordering and review of laboratory studies, ordering and review  of radiographic studies, pulse oximetry and re-evaluation of patient's condition.   MDM  MSE complete   Jeffery [name] was evaluated in Emergency Department on 05/07/2021 for the symptoms described in the history of present illness. He was evaluated in the context of the global COVID-19 pandemic, which necessitated consideration that the patient might be at risk for infection with the SARS-CoV-2 virus that causes COVID-19. Institutional protocols and algorithms that pertain to the evaluation of patients at risk for COVID-19 are in a state of rapid change based on information released by regulatory bodies including the CDC and federal and state organizations. These policies and algorithms were followed during the patient's care in the ED.  Patient is presenting with complaint of abdominal discomfort.  Initial CT imaging did not demonstrate acute pathology.  Patient with increased abdominal pain while awaiting ED evaluation.  Repeat CT imaging demonstrates perforated pyloric ulceration with associated pneumoperitoneum.  Broad-spectrum antibiotics initiated in the ED.  Surgery [facility]) is aware of case and will evaluate for admission.     Final Clinical Impression(s) / ED Diagnoses Final diagnoses:  Generalized abdominal pain  Pneumoperitoneum    Rx / DC Orders ED Discharge Orders     None        Wynetta Fines, MD 05/07/21 [phone]

## 2021-05-07 NOTE — Anesthesia Procedure Notes (Signed)
Procedure Name: Intubation Date/Time: 05/07/2021 8:11 PM Performed by: Eligha Bridegroom, CRNA Pre-anesthesia Checklist: Patient identified, Emergency Drugs available, Suction available, Patient being monitored and Timeout performed Patient Re-evaluated:Patient Re-evaluated prior to induction Oxygen Delivery Method: Circle system utilized Preoxygenation: Pre-oxygenation with 100% oxygen Induction Type: IV induction and Rapid sequence Laryngoscope Size: Mac and 4 Grade View: Grade I Tube size: 7.5 mm Number of attempts: 1 Placement Confirmation: positive ETCO2 and breath sounds checked- equal and bilateral Secured at: 22 cm Tube secured with: Tape Dental Injury: Teeth and Oropharynx as per pre-operative assessment

## 2021-05-07 NOTE — Anesthesia Postprocedure Evaluation (Signed)
Anesthesia Post Note  Patient: Jeffery [name]  Procedure(s) Performed: EXPLORATORY LAPAROTOMY AND REPAIR OF PYLORIC CHANNEL ULCER (Abdomen)     Patient location during evaluation: PACU Anesthesia Type: General Level of consciousness: awake and alert, awake and oriented Pain management: pain level controlled Vital Signs Assessment: post-procedure vital signs reviewed and stable Respiratory status: spontaneous breathing, nonlabored ventilation and respiratory function stable Cardiovascular status: blood pressure returned to baseline and stable Postop Assessment: no apparent nausea or vomiting Anesthetic complications: no   No notable events documented.  Last Vitals:  Vitals:   05/07/21 2218 05/07/21 2249  BP: (!) 153/101 (!) 154/97  Pulse: 78 77  Resp: 17 17  Temp:  (!) 36.4 C  SpO2: 98% 96%    Last Pain:  Vitals:   05/07/21 2249  TempSrc: Oral  PainSc:                  Cecile Hearing

## 2021-05-07 NOTE — Op Note (Signed)
PRE-OPERATIVE DIAGNOSIS: perforated ulcer  POST-OPERATIVE DIAGNOSIS:  Same  PROCEDURE:  Procedure(s): Exploratory laparotomy, graham patch repair of perforated pyloric ulcer  SURGEON:  Surgeon(s): Almond Lint, MD  ANESTHESIA:   general  DRAINS: none   LOCAL MEDICATIONS USED:  NONE  SPECIMEN:  No Specimen  DISPOSITION OF SPECIMEN:  N/A  COUNTS:  YES  DICTATION: .Dragon Dictation  PLAN OF CARE: Admit to inpatient   PATIENT DISPOSITION:  PACU - hemodynamically stable.  FINDINGS:  perforated ulcer in posterior pyloric channel  EBL: 50 mL  PROCEDURE:     Patient was identified in the holding area and then taken the operating room where he was placed supine on the operating room table.  General endotracheal anesthesia was induced.  A Foley catheter was placed.  His abdomen was then prepped and draped in sterile fashion.  A timeout was performed according to the surgical safety checklist.  When all was correct, we continued.  Vertical midline incision was made in the upper abdomen.  The subcutaneous tissue was divided with the cautery.  The fascia was then entered in the midline at the superior aspect of the incision with the cautery.  The fascial incision was carried out the length of the skin incision.  The abdominal wall was elevated and the peritoneum was entered sharply.  The gastric fluid was suctioned out of the abdomen.  There was no anterior ulcer seen.  The lesser sac was opened as it appeared that there was some bilious stained material in that region.  The perforation was seen at the far distal pylorus right up against the beginning of the pancreas.  The lesser sac was suctioned out and the abdomen was irrigated.  The omentum off of the distal portion of the stomach was easily able to be flipped back to this location.  Five interrupted 2-0 Vicryl sutures were placed on either side of the defect.  These were used to secure the tongue of the omentum down.  The NG tube was  confirmed to be in the stomach.  The abdomen was irrigated copiously.  The fascia was then closed using a running #1 looped PDS suture x2.  The knot was secured down with a 3-0 Vicryl.  The skin was then irrigated and closed with skin staples.  The wounds were then cleaned, dried, and dressed with a Covaderm dressing.  The patient was allowed to emerge from anesthesia and taken to the PACU in stable condition.  Needle, sponge, and instrument counts were correct x2.   CASE DATA:  Type of patient?: DOW CASE (Surgical Hospitalist [facility] Inpatient)  Status of Case? EMERGENT Add On  Infection Present At Time Of Surgery (PATOS)?  INFECTION of the lesser sac and peritoneal cavity with gastric secretions throughout the abdomen as well as pus present upon entrance into the peritoneal cavity.

## 2021-05-07 NOTE — ED Notes (Signed)
Pt found by NT Tia rolling on the floor screaming. Pt assessed by the nurse. Pt denies head or neck pain, pt stating he sat in the floor due to abdominal pain. Pt reports feeling nauseas. Britney, PA notified and orders placed.

## 2021-05-07 NOTE — Transfer of Care (Signed)
Immediate Anesthesia Transfer of Care Note  Patient: Jeffery [name]  Procedure(s) Performed: EXPLORATORY LAPAROTOMY AND REPAIR OF PYLORIC CHANNEL ULCER (Abdomen)  Patient Location: PACU  Anesthesia Type:General  Level of Consciousness: awake, alert , oriented and patient cooperative  Airway & Oxygen Therapy: Patient Spontanous Breathing  Post-op Assessment: Report given to RN and Post -op Vital signs reviewed and stable  Post vital signs: Reviewed and stable  Last Vitals:  Vitals Value Taken Time  BP 163/112 05/07/21 2140  Temp 36.3 C 05/07/21 2140  Pulse 82 05/07/21 2143  Resp 20 05/07/21 2143  SpO2 96 % 05/07/21 2143  Vitals shown include unvalidated device data.  Last Pain:  Vitals:   05/07/21 1902  TempSrc:   PainSc: Asleep         Complications: No notable events documented.

## 2021-05-07 NOTE — Anesthesia Preprocedure Evaluation (Addendum)
Anesthesia Evaluation  Patient identified by MRN, date of birth, ID band Patient awake    Reviewed: Allergy & Precautions, NPO status , Patient's Chart, lab work & pertinent test results  Airway Mallampati: II  TM Distance: >3 FB Neck ROM: Full    Dental  (+) Teeth Intact, Dental Advisory Given, Loose,    Pulmonary COPD, former smoker,    Pulmonary exam normal breath sounds clear to auscultation       Cardiovascular negative cardio ROS Normal cardiovascular exam Rhythm:Regular Rate:Normal     Neuro/Psych negative neurological ROS  negative psych ROS   GI/Hepatic Neg liver ROS, Perforated ulcer   Endo/Other  negative endocrine ROS  Renal/GU negative Renal ROS     Musculoskeletal negative musculoskeletal ROS (+)   Abdominal   Peds  Hematology negative hematology ROS (+)   Anesthesia Other Findings   Reproductive/Obstetrics                            Anesthesia Physical Anesthesia Plan  ASA: 2 and emergent  Anesthesia Plan: General   Post-op Pain Management:    Induction: Intravenous  PONV Risk Score and Plan: 3 and Midazolam, Dexamethasone and Ondansetron  Airway Management Planned: Oral ETT  Additional Equipment:   Intra-op Plan:   Post-operative Plan: Possible Post-op intubation/ventilation  Informed Consent: I have reviewed the patients History and Physical, chart, labs and discussed the procedure including the risks, benefits and alternatives for the proposed anesthesia with the patient or authorized representative who has indicated his/her understanding and acceptance.     Dental advisory given  Plan Discussed with: CRNA  Anesthesia Plan Comments:        Anesthesia Quick Evaluation

## 2021-05-07 NOTE — ED Triage Notes (Signed)
Pt arrived POV complaining of left side abdominal pain that started about 4days ago, has gotten worse this morning  Has had N/V, no BM in 3 days

## 2021-05-07 NOTE — ED Provider Notes (Addendum)
Emergency Medicine Provider Triage Evaluation Note  Jeffery [name] , [name] 55 y.o. male  was evaluated in triage.  Pt complains of left flank pain and llq pain. Feels like needs to urinate however has decreased urination. Last BM 4 days ago. Typically goes daily. Multiple episodes of NBNB emesis. Hx of kidney stones. No hx of diverticulitis.  Review of Systems  Positive: Flank pain, llq pain, decreased urination, constipation Negative: fever  Physical Exam  There were no vitals taken for this visit. Gen:   Awake, no distress   Resp:  Normal effort  MSK:   Moves extremities without difficulty ABD:  Tenderness to left flank and LLQ Other:    Medical Decision Making  Medically screening exam initiated at 8:32 AM.  Appropriate orders placed.  Jeffery [name] was informed that the remainder of the evaluation will be completed by another provider, this initial triage assessment does not replace that evaluation, and the importance of remaining in the ED until their evaluation is complete.  Flank pain, LLQ pain, NBNB emesis, decreased urination   Jeffery Crewe A, PA-C 05/07/21 0840    Pollyann Savoy, MD 05/07/21 1015   ADDEND: Patient now states worsening pain, diaphoretic, writhing in pain. CT Stone neg. Will order dissection study given previous study without contrast and patient diaphoretic with worsening pain.   [name], PA-C 05/07/21 1210    Pollyann Savoy, MD 05/07/21 [phone]

## 2021-05-08 ENCOUNTER — Encounter (HOSPITAL_COMMUNITY): Payer: Self-pay | Admitting: General Surgery

## 2021-05-08 LAB — CBC
HCT: 43.9 % (ref 39.0–52.0)
Hemoglobin: 15.5 g/dL (ref 13.0–17.0)
MCH: 30.5 pg (ref 26.0–34.0)
MCHC: 35.3 g/dL (ref 30.0–36.0)
MCV: 86.4 fL (ref 80.0–100.0)
Platelets: 200 10*3/uL (ref 150–400)
RBC: 5.08 MIL/uL (ref 4.22–5.81)
RDW: 13.1 % (ref 11.5–15.5)
WBC: 27.2 10*3/uL — ABNORMAL HIGH (ref 4.0–10.5)
nRBC: 0 % (ref 0.0–0.2)

## 2021-05-08 LAB — BASIC METABOLIC PANEL
Anion gap: 10 (ref 5–15)
BUN: 22 mg/dL — ABNORMAL HIGH (ref 6–20)
CO2: 23 mmol/L (ref 22–32)
Calcium: 8.6 mg/dL — ABNORMAL LOW (ref 8.9–10.3)
Chloride: 100 mmol/L (ref 98–111)
Creatinine, Ser: 0.94 mg/dL (ref 0.61–1.24)
GFR, Estimated: >60 mL/min (ref >60)
Glucose, Bld: 156 mg/dL — ABNORMAL HIGH (ref 70–99)
Potassium: 3.7 mmol/L (ref 3.5–5.1)
Sodium: 133 mmol/L — ABNORMAL LOW (ref 135–145)

## 2021-05-08 LAB — PROTIME-INR
INR: 1.2 (ref 0.8–1.2)
Prothrombin Time: 14.9 seconds (ref 11.4–15.2)

## 2021-05-08 MED ORDER — DIPHENHYDRAMINE HCL 12.5 MG/5ML PO ELIX: 12.5000 mg | ORAL_SOLUTION | Freq: Four times a day (QID) | ORAL | Status: DC | PRN

## 2021-05-08 MED ORDER — MORPHINE SULFATE 1 MG/ML IV SOLN PCA
INTRAVENOUS | Status: DC
Start: 2021-05-08 — End: 2021-05-11
  Administered 2021-05-08: 13.19 mg via INTRAVENOUS
  Administered 2021-05-09: 6 mg via INTRAVENOUS
  Administered 2021-05-09: 12 mg via INTRAVENOUS
  Administered 2021-05-10: 5.91 mg via INTRAVENOUS
  Administered 2021-05-10: 3 mg via INTRAVENOUS
  Administered 2021-05-10: 1.5 mg via INTRAVENOUS
  Administered 2021-05-10: 6 mg via INTRAVENOUS
  Administered 2021-05-10: 3 mg via INTRAVENOUS
  Administered 2021-05-10: 7.5 mg via INTRAVENOUS
  Administered 2021-05-11 (×2): 3 mg via INTRAVENOUS
  Filled 2021-05-08 (×3): qty 30

## 2021-05-08 MED ORDER — MORPHINE SULFATE 1 MG/ML IV SOLN PCA: INTRAVENOUS | Status: DC

## 2021-05-08 MED ORDER — ONDANSETRON HCL 4 MG/2ML IJ SOLN: 4.0000 mg | Freq: Four times a day (QID) | INTRAMUSCULAR | Status: DC | PRN

## 2021-05-08 MED ORDER — NALOXONE HCL 0.4 MG/ML IJ SOLN: 0.4000 mg | INTRAMUSCULAR | Status: DC | PRN

## 2021-05-08 MED ORDER — SODIUM CHLORIDE 0.9% FLUSH: 9.0000 mL | INTRAVENOUS | Status: DC | PRN

## 2021-05-08 MED ORDER — IPRATROPIUM-ALBUTEROL 0.5-2.5 (3) MG/3ML IN SOLN: 3.0000 mL | Freq: Four times a day (QID) | RESPIRATORY_TRACT | Status: DC | PRN

## 2021-05-08 MED ORDER — DIPHENHYDRAMINE HCL 50 MG/ML IJ SOLN: 12.5000 mg | Freq: Four times a day (QID) | INTRAMUSCULAR | Status: DC | PRN

## 2021-05-08 NOTE — Progress Notes (Signed)
Patient ID: Jeffery [name], male   DOB: 1965-08-24, 55 y.o.   MRN: [id number]  [facility] Surgery Progress Note  1 Day Post-Op  Subjective: CC-  [place] at bedside. Abdomen sore but pain well controlled with PCA. No flatus or BM. NG tube in but not working.  Objective: Vital signs in last 24 hours: Temp:  [97.4 F (36.3 C)-98 F (36.7 C)] 98 F (36.7 C) (09/21 0730) Pulse Rate:  [70-103] 70 (09/21 0730) Resp:  [10-24] 18 (09/21 0730) BP: (106-192)/(59-120) 130/79 (09/21 0730) SpO2:  [95 %-100 %] 98 % (09/21 0730) FiO2 (%):  [97 %] 97 % (09/21 0722)    Intake/Output from previous day: 09/20 0701 - 09/21 0700 In: 1500 [I.V.:1500] Out: 550 [Urine:100; Blood:50] Intake/Output this shift: No intake/output data recorded.  PE: Gen:  Alert, NAD, pleasant Card:  RRR Pulm: rate and effort normal Abd: Soft, ND, appropriately tender, +BS, midline incision cdi with staples present and no erythema or drainage  Lab Results:  Recent Labs    05/07/21 0852 05/08/21 0231  WBC 15.6* 27.2*  HGB 17.6* 15.5  HCT 49.5 43.9  PLT 269 200   BMET Recent Labs    05/07/21 0852 05/08/21 0231  NA 132* 133*  K 3.5 3.7  CL 91* 100  CO2 27 23  GLUCOSE 121* 156*  BUN 20 22*  CREATININE 1.06 0.94  CALCIUM 10.2 8.6*   PT/INR Recent Labs    05/08/21 0231  LABPROT 14.9  INR 1.2   CMP     Component Value Date/Time   NA 133 (L) 05/08/2021 0231   K 3.7 05/08/2021 0231   CL 100 05/08/2021 0231   CO2 23 05/08/2021 0231   GLUCOSE 156 (H) 05/08/2021 0231   BUN 22 (H) 05/08/2021 0231   CREATININE 0.94 05/08/2021 0231   CALCIUM 8.6 (L) 05/08/2021 0231   PROT 7.7 05/07/2021 0852   ALBUMIN 4.2 05/07/2021 0852   AST 23 05/07/2021 0852   ALT 24 05/07/2021 0852   ALKPHOS 96 05/07/2021 0852   BILITOT 3.3 (H) 05/07/2021 0852   GFRNONAA >60 05/08/2021 0231   GFRAA >60 02/01/2018 0828   Lipase     Component Value Date/Time   LIPASE 30 05/07/2021 0852        Studies/Results: DG Abd Portable 1V  Result Date: 05/07/2021 CLINICAL DATA:  NG placement. EXAM: PORTABLE ABDOMEN - 1 VIEW COMPARISON:  CT of the chest abdomen pelvis dated 05/07/2021. FINDINGS: Enteric tube with tip and side-port in the left upper abdomen in the body of the stomach. No bowel dilatation noted. Air is noted under the right hemidiaphragm as seen on the prior CT. Midline vertical anterior abdominal wall cutaneous staples. IMPRESSION: 1. Enteric tube with tip and side-port in the body of the stomach. 2. Pneumoperitoneum. Electronically Signed   By: Elgie Collard M.D.   On: 05/07/2021 22:19   CT Renal Stone Study  Result Date: 05/07/2021 CLINICAL DATA:  Flank pain, kidney stone suspected EXAM: CT ABDOMEN AND PELVIS WITHOUT CONTRAST TECHNIQUE: Multidetector CT imaging of the abdomen and pelvis was performed following the standard protocol without IV contrast. COMPARISON:  CT February 01, 2018. FINDINGS: Lower chest: Mild chronic left basilar scarring. Otherwise, visualized lung bases are clear. Hepatobiliary: Small hypodensities within the liver, similar to prior likely cysts. These lesions were better characterized on prior with contrast study. Unremarkable gallbladder. Pancreas: Unremarkable. No pancreatic ductal dilatation or surrounding inflammatory changes. Spleen: Normal in size without focal abnormality. Adrenals/Urinary Tract: Adrenal glands  are unremarkable. Kidneys are normal, without renal calculi, focal lesion, or hydronephrosis. Bladder is decompressed, limiting evaluation. Stomach/Bowel: Stomach is within normal limits. Appendix is not confidently identified; however, no right lower quadrant inflammatory changes to suggest appendicitis. No evidence of bowel wall thickening, distention, or inflammatory changes. Vascular/Lymphatic: Aortic atherosclerosis. No enlarged abdominal or pelvic lymph nodes. Reproductive: Chronic prostatic calcifications.  Prostatomegaly. Other: No  abdominal wall hernia or abnormality. No abdominopelvic ascites. Musculoskeletal: Mild degenerative changes in the lumbar spine with degenerative endplate Schmorl's nodes and osteophytes. No evidence of acute fracture. IMPRESSION: 1. No acute findings in the abdomen or pelvis. 2. Prostatomegaly with chronic prostate calcifications. Electronically Signed   By: Feliberto Harts M.D.   On: 05/07/2021 10:40   CT Angio Chest/Abd/Pel for Dissection W and/or W/WO  Result Date: 05/07/2021 CLINICAL DATA:  Chest and back pain EXAM: CT ANGIOGRAPHY CHEST, ABDOMEN AND PELVIS TECHNIQUE: Non-contrast CT of the chest was initially obtained. Multidetector CT imaging through the chest, abdomen and pelvis was performed using the standard protocol during bolus administration of intravenous contrast. Multiplanar reconstructed images and MIPs were obtained and reviewed to evaluate the vascular anatomy. CONTRAST:  11mL OMNIPAQUE IOHEXOL 350 MG/ML SOLN COMPARISON:  09/09/2020 FINDINGS: CTA CHEST FINDINGS Cardiovascular: Preferential opacification of the thoracic aorta. Normal contour and caliber of the thoracic aorta. No evidence of aneurysm, dissection, or other acute aortic pathology. Minimal atherosclerosis. Normal heart size. No pericardial effusion. Mediastinum/Nodes: No enlarged mediastinal, hilar, or axillary lymph nodes. Thyroid gland, trachea, and esophagus demonstrate no significant findings. Lungs/Pleura: Minimal centrilobular and paraseptal emphysema. Background of very fine centrilobular pulmonary nodularity, most concentrated in the lung apices. Diffuse bilateral bronchial wall thickening. No pleural effusion or pneumothorax. Musculoskeletal: No chest wall abnormality. No acute or significant osseous findings. Review of the MIP images confirms the above findings. CTA ABDOMEN AND PELVIS FINDINGS VASCULAR Normal contour and caliber of the abdominal aorta. Evidence of aneurysm, dissection, or other acute aortic pathology.  Standard branching pattern of the abdominal aorta, with solitary bilateral renal arteries. The branch vessel origins are patent. Mild mixed atherosclerosis. 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 NON-VASCULAR Hepatobiliary: No solid liver abnormality is seen. No gallstones, gallbladder wall thickening, or biliary dilatation. Pancreas: Unremarkable. No pancreatic ductal dilatation or surrounding inflammatory changes. Spleen: Normal in size without significant abnormality. Adrenals/Urinary Tract: Adrenal glands are unremarkable. Kidneys are normal, without renal calculi, solid lesion, or hydronephrosis. Bladder is unremarkable. Stomach/Bowel: There is a perforated ulceration of the posterior pylorus with adjacent inflammatory fat stranding and fluid (series 5, image 167). Appendix appears normal. No evidence of bowel wall thickening, distention, or inflammatory changes. Lymphatic: No enlarged abdominal or pelvic lymph nodes. Reproductive: No mass or other significant abnormality. Other: No abdominal wall hernia or abnormality. Pneumoperitoneum within the lesser and greater omental sacs. Musculoskeletal: No acute or significant osseous findings. Review of the MIP images confirms the above findings. IMPRESSION: 1. There is a perforated ulceration of the posterior pylorus with adjacent inflammatory fat stranding and fluid. Associated pneumoperitoneum. 2. No evidence of thoracic or abdominal aortic aneurysm, dissection, or other acute aortic pathology. Mild atherosclerosis. 3. 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 4. Minimal emphysema and diffuse bilateral bronchial wall thickening. 5. Background of very fine centrilobular pulmonary nodularity,  most concentrated  in the lung apices, most commonly seen in smoking-related respiratory bronchiolitis. Aortic Atherosclerosis (ICD10-I70.0) and Emphysema (ICD10-J43.9). Electronically Signed   By: Lauralyn Primes M.D.   On: 05/07/2021 13:44    Anti-infectives: Anti-infectives (From admission, onward)    Start     Dose/Rate Route Frequency Ordered Stop   05/08/21 1000  cefTRIAXone (ROCEPHIN) 2 g in sodium chloride 0.9 % 100 mL IVPB        2 g 200 mL/hr over 30 Minutes Intravenous Every 24 hours 05/07/21 2239 05/13/21 0959   05/07/21 2330  metroNIDAZOLE (FLAGYL) IVPB 500 mg        500 mg 100 mL/hr over 60 Minutes Intravenous Every 12 hours 05/07/21 2239 05/12/21 2329   05/07/21 1730  ceFEPIme (MAXIPIME) 2 g in sodium chloride 0.9 % 100 mL IVPB       See Hyperspace for full Linked Orders Report.   2 g 200 mL/hr over 30 Minutes Intravenous  Once 05/07/21 1720 05/07/21 1808   05/07/21 1730  metroNIDAZOLE (FLAGYL) IVPB 500 mg       See Hyperspace for full Linked Orders Report.   500 mg 100 mL/hr over 60 Minutes Intravenous  Once 05/07/21 1720 05/07/21 1847        Assessment/Plan Perforated ulcer in posterior pyloric channel POD#1 s/p Exploratory laparotomy, graham patch repair of perforated pyloric ulcer 9/20 Dr. Donell Beers - Continue NPO/NGT to LIWS (wall suction is not working, will see if we can get this fixed) - Plan for UGI a few days postop prior to considering PO - Continue PCA and IV tylenol for pain control - continue abx - check H pylori - mobilize, pulm toilet  ID - maxipime 9/20 x1, flagyl 9/20>>, rocephin 9/21>> FEN - IVF, NPO/NGT to LIWS VTE - lovenox Foley - d/c today when mobilizing  Tobacco abuse COPD   LOS: 1 day    Franne Forts, [facility] 05/08/2021, 9:34 AM Please see Amion for pager number during day hours 7:00am-4:30pm

## 2021-05-08 NOTE — Progress Notes (Signed)
Eval complete, documentation pending. Able to mobilize on a supervision basis with RW. For now, recommend HHPT f/u, however may be able to progress past this recommendation as he continues to improve medically  Lerry Liner PT, DPT, PN2   Supplemental Physical Therapist [facility]    Pager [phone] Acute Rehab Office [phone]

## 2021-05-08 NOTE — Evaluation (Signed)
Physical Therapy Evaluation Patient Details Name: Jeffery [name] MRN: [id number] DOB: 06-Feb-1966 Today's Date: 05/08/2021  History of Present Illness  55yo male who presented on 9/20 with progressive abdominal pain. CT showed perforated pyloric ulcer. Received exploratory laparotomy and repair of pyloric channel ulcer 9/20. PMH COPD  Clinical Impression   Patient received in bed, pleasant and cooperative with therapy. Really able to mobilize quite well, just limited by pain today but very motivated to improve. Anticipate he'll make great progress as he improves medically, and may not even be in need of skilled PT services by the time he's ready for DC- will reassess as appropriate as he continues to recover. Left in chair with all needs met, RN aware of patient status.        Recommendations for follow up therapy are one component of a multi-disciplinary discharge planning process, led by the attending physician.  Recommendations may be updated based on patient status, additional functional criteria and insurance authorization.  Follow Up Recommendations Home health PT    Equipment Recommendations  Rolling walker with 5" wheels    Recommendations for Other Services       Precautions / Restrictions Precautions Precautions: Other (comment) Precaution Comments: abdominal incision, NG tube Restrictions Weight Bearing Restrictions: No      Mobility  Bed Mobility Overal bed mobility: Needs Assistance Bed Mobility: Rolling;Sidelying to Sit Rolling: Min assist Sidelying to sit: Min assist       General bed mobility comments: MinA to roll over onto his side, and to boost up to midline sitting with HOB minimally elevated    Transfers Overall transfer level: Needs assistance Equipment used: Rolling walker (2 wheeled) Transfers: Sit to/from Stand Sit to Stand: Supervision         General transfer comment: S for safety/cues for hand placement  Ambulation/Gait Ambulation/Gait  assistance: Supervision Gait Distance (Feet): 120 Feet Assistive device: Rolling walker (2 wheeled) Gait Pattern/deviations: Step-through pattern;Decreased step length - right;Decreased step length - left;Trunk flexed;Antalgic Gait velocity: decreased   General Gait Details: slow and steady with RW, very antalgic but pain was managable as long as he took his time with gait  Stairs            Wheelchair Mobility    Modified Rankin (Stroke Patients Only)       Balance Overall balance assessment: Mild deficits observed, not formally tested                                           Pertinent Vitals/Pain Pain Assessment: 0-10 Pain Score: 3  Pain Location: stomach/surgery area Pain Descriptors / Indicators: Aching;Stabbing;Throbbing Pain Intervention(s): Limited activity within patient's tolerance;Monitored during session    [place] expects to be discharged to:: Private residence Living Arrangements: Spouse/significant other Available Help at Discharge: Family;Available PRN/intermittently Type of Home: Mobile home Home Access: Stairs to enter Entrance Stairs-Rails: Left Entrance Stairs-Number of Steps: 3 with L ascending rail Home Layout: One level Home Equipment: Walker - 2 wheels;Cane - single point      Prior Function Level of Independence: Independent         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        Extremity/Trunk Assessment   Upper Extremity Assessment Upper Extremity Assessment: Overall WFL for tasks assessed    Lower Extremity Assessment Lower Extremity Assessment: Overall WFL for tasks  assessed    Cervical / Trunk Assessment Cervical / Trunk Assessment: Normal  Communication   Communication: No difficulties  Cognition Arousal/Alertness: Awake/alert Behavior During Therapy: WFL for tasks assessed/performed Overall Cognitive Status: Within Functional Limits for tasks assessed                                         General Comments      Exercises     Assessment/Plan    PT Assessment Patient needs continued PT services  PT Problem List Decreased knowledge of use of DME;Decreased activity tolerance;Decreased safety awareness;Decreased balance;Decreased knowledge of precautions;Pain;Decreased mobility       PT Treatment Interventions DME instruction;Balance training;Gait training;Stair training;Functional mobility training;Patient/family education;Therapeutic activities;Therapeutic exercise    PT Goals (Current goals can be found in the Care Plan section)  Acute Rehab PT Goals Patient Stated Goal: less pain PT Goal Formulation: With patient Time For Goal Achievement: 05/22/21 Potential to Achieve Goals: Good    Frequency Min 3X/week   Barriers to discharge        Co-evaluation               AM-PAC PT "6 Clicks" Mobility  Outcome Measure Help needed turning from your back to your side while in a flat bed without using bedrails?: A Little Help needed moving from lying on your back to sitting on the side of a flat bed without using bedrails?: A Little Help needed moving to and from a bed to a chair (including a wheelchair)?: A Little Help needed standing up from a chair using your arms (e.g., wheelchair or bedside chair)?: A Little Help needed to walk in hospital room?: A Little Help needed climbing 3-5 steps with a railing? : A Little 6 Click Score: 18    End of Session   Activity Tolerance: Patient tolerated treatment well Patient left: in chair;with call bell/phone within reach Nurse Communication: Mobility status PT Visit Diagnosis: Pain;Other abnormalities of gait and mobility (R26.89);Difficulty in walking, not elsewhere classified (R26.2) Pain - Right/Left:  (midline) Pain - part of body:  (abdomen)    Time: 1310-1350 PT Time Calculation (min) (ACUTE ONLY): 40 min   Charges:   PT Evaluation $PT Eval Low  Complexity: 1 Low PT Treatments $Gait Training: 8-22 mins $Therapeutic Activity: 8-22 mins       Windell Norfolk, DPT, PN2   Supplemental Physical Therapist [place]    Pager [phone] Acute Rehab Office [phone]

## 2021-05-09 LAB — BASIC METABOLIC PANEL
Anion gap: 7 (ref 5–15)
BUN: 14 mg/dL (ref 6–20)
CO2: 32 mmol/L (ref 22–32)
Calcium: 8.9 mg/dL (ref 8.9–10.3)
Chloride: 97 mmol/L — ABNORMAL LOW (ref 98–111)
Creatinine, Ser: 0.87 mg/dL (ref 0.61–1.24)
GFR, Estimated: >60 mL/min (ref >60)
Glucose, Bld: 109 mg/dL — ABNORMAL HIGH (ref 70–99)
Potassium: 3.4 mmol/L — ABNORMAL LOW (ref 3.5–5.1)
Sodium: 136 mmol/L (ref 135–145)

## 2021-05-09 LAB — CBC
HCT: 42.1 % (ref 39.0–52.0)
Hemoglobin: 14.7 g/dL (ref 13.0–17.0)
MCH: 30.5 pg (ref 26.0–34.0)
MCHC: 34.9 g/dL (ref 30.0–36.0)
MCV: 87.3 fL (ref 80.0–100.0)
Platelets: 185 10*3/uL (ref 150–400)
RBC: 4.82 MIL/uL (ref 4.22–5.81)
RDW: 13.2 % (ref 11.5–15.5)
WBC: 20.6 10*3/uL — ABNORMAL HIGH (ref 4.0–10.5)
nRBC: 0 % (ref 0.0–0.2)

## 2021-05-09 LAB — HEMOGLOBIN A1C
Hgb A1c MFr Bld: 5.7 % — ABNORMAL HIGH (ref 4.8–5.6)
Mean Plasma Glucose: 117 mg/dL

## 2021-05-09 LAB — MAGNESIUM: Magnesium: 1.9 mg/dL (ref 1.7–2.4)

## 2021-05-09 MED ORDER — ACETAMINOPHEN 10 MG/ML IV SOLN
1000.0000 mg | Freq: Four times a day (QID) | INTRAVENOUS | Status: AC
  Administered 2021-05-09 – 2021-05-10 (×4): 1000 mg via INTRAVENOUS
  Filled 2021-05-09 (×4): qty 100

## 2021-05-09 MED ORDER — PANTOPRAZOLE SODIUM 40 MG IV SOLR
40.0000 mg | Freq: Two times a day (BID) | INTRAVENOUS | Status: DC
  Administered 2021-05-09 – 2021-05-12 (×8): 40 mg via INTRAVENOUS
  Filled 2021-05-09 (×8): qty 40

## 2021-05-09 MED ORDER — POTASSIUM CHLORIDE 10 MEQ/100ML IV SOLN
10.0000 meq | INTRAVENOUS | Status: AC
  Administered 2021-05-09 (×3): 10 meq via INTRAVENOUS
  Filled 2021-05-09 (×3): qty 100

## 2021-05-09 NOTE — Plan of Care (Signed)

## 2021-05-09 NOTE — Plan of Care (Signed)

## 2021-05-09 NOTE — Progress Notes (Signed)
Patient ID: Jeffery [name], male   DOB: [birth year], 55 y.o.   MRN: [id number]  [name] Progress Note  2 Days Post-Op  Subjective: CC-  Overall feeling better. Pain well controlled. No flatus or BM. NG with 1700cc output last 24 hours, blood tinged. Already ambulated this morning.  Objective: Vital signs in last 24 hours: Temp:  [97.5 F (36.4 C)-98.8 F (37.1 C)] 98.8 F (37.1 C) (09/22 0826) Pulse Rate:  [66-78] 76 (09/22 0826) Resp:  [16-22] 19 (09/22 0826) BP: (126-142)/(81-96) 136/96 (09/22 0826) SpO2:  [95 %-98 %] 96 % (09/22 0826) FiO2 (%):  [95 %-97 %] 95 % (09/22 0821)    Intake/Output from previous day: 09/21 0701 - 09/22 0700 In: 4229.7 [I.V.:3494; NG/GT:30; IV Piggyback:705.7] Out: 2700 [Urine:1000; Emesis/NG output:1700] Intake/Output this shift: Total I/O In: -  Out: 600 [Urine:200; Emesis/NG output:400]  PE: Gen:  Alert, NAD, pleasant Card:  RRR Pulm: rate and effort normal Abd: Soft, ND, appropriately tender, hypoactive BS, midline incision cdi with staples present and no erythema or drainage  Lab Results:  Recent Labs    05/08/21 0231 05/09/21 0034  WBC 27.2* 20.6*  HGB 15.5 14.7  HCT 43.9 42.1  PLT 200 185   BMET Recent Labs    05/08/21 0231 05/09/21 0034  NA 133* 136  K 3.7 3.4*  CL 100 97*  CO2 23 32  GLUCOSE 156* 109*  BUN 22* 14  CREATININE 0.94 0.87  CALCIUM 8.6* 8.9   PT/INR Recent Labs    05/08/21 0231  LABPROT 14.9  INR 1.2   CMP     Component Value Date/Time   NA 136 05/09/2021 0034   K 3.4 (L) 05/09/2021 0034   CL 97 (L) 05/09/2021 0034   CO2 32 05/09/2021 0034   GLUCOSE 109 (H) 05/09/2021 0034   BUN 14 05/09/2021 0034   CREATININE 0.87 05/09/2021 0034   CALCIUM 8.9 05/09/2021 0034   PROT 7.7 05/07/2021 0852   ALBUMIN 4.2 05/07/2021 0852   AST 23 05/07/2021 0852   ALT 24 05/07/2021 0852   ALKPHOS 96 05/07/2021 0852   BILITOT 3.3 (H) 05/07/2021 0852   GFRNONAA >60 05/09/2021 0034   GFRAA  >60 02/01/2018 0828   Lipase     Component Value Date/Time   LIPASE 30 05/07/2021 0852       Studies/Results: DG Abd Portable 1V  Result Date: 05/07/2021 CLINICAL DATA:  NG placement. EXAM: PORTABLE ABDOMEN - 1 VIEW COMPARISON:  CT of the chest abdomen pelvis dated 05/07/2021. FINDINGS: Enteric tube with tip and side-port in the left upper abdomen in the body of the stomach. No bowel dilatation noted. Air is noted under the right hemidiaphragm as seen on the prior CT. Midline vertical anterior abdominal wall cutaneous staples. IMPRESSION: 1. Enteric tube with tip and side-port in the body of the stomach. 2. Pneumoperitoneum. Electronically Signed   By: Elgie Collard M.D.   On: 05/07/2021 22:19   CT Renal Stone Study  Result Date: 05/07/2021 CLINICAL DATA:  Flank pain, kidney stone suspected EXAM: CT ABDOMEN AND PELVIS WITHOUT CONTRAST TECHNIQUE: Multidetector CT imaging of the abdomen and pelvis was performed following the standard protocol without IV contrast. COMPARISON:  CT February 01, 2018. FINDINGS: Lower chest: Mild chronic left basilar scarring. Otherwise, visualized lung bases are clear. Hepatobiliary: Small hypodensities within the liver, similar to prior likely cysts. These lesions were better characterized on prior with contrast study. Unremarkable gallbladder. Pancreas: Unremarkable. No pancreatic ductal dilatation or surrounding  inflammatory changes. Spleen: Normal in size without focal abnormality. Adrenals/Urinary Tract: Adrenal glands are unremarkable. Kidneys are normal, without renal calculi, focal lesion, or hydronephrosis. Bladder is decompressed, limiting evaluation. Stomach/Bowel: Stomach is within normal limits. Appendix is not confidently identified; however, no right lower quadrant inflammatory changes to suggest appendicitis. No evidence of bowel wall thickening, distention, or inflammatory changes. Vascular/Lymphatic: Aortic atherosclerosis. No enlarged abdominal or  pelvic lymph nodes. Reproductive: Chronic prostatic calcifications.  Prostatomegaly. Other: No abdominal wall hernia or abnormality. No abdominopelvic ascites. Musculoskeletal: Mild degenerative changes in the lumbar spine with degenerative endplate Schmorl's nodes and osteophytes. No evidence of acute fracture. IMPRESSION: 1. No acute findings in the abdomen or pelvis. 2. Prostatomegaly with chronic prostate calcifications. Electronically Signed   By: Feliberto Harts M.D.   On: 05/07/2021 10:40   CT Angio Chest/Abd/Pel for Dissection W and/or W/WO  Result Date: 05/07/2021 CLINICAL DATA:  Chest and back pain EXAM: CT ANGIOGRAPHY CHEST, ABDOMEN AND PELVIS TECHNIQUE: Non-contrast CT of the chest was initially obtained. Multidetector CT imaging through the chest, abdomen and pelvis was performed using the standard protocol during bolus administration of intravenous contrast. Multiplanar reconstructed images and MIPs were obtained and reviewed to evaluate the vascular anatomy. CONTRAST:  25mL OMNIPAQUE IOHEXOL 350 MG/ML SOLN COMPARISON:  09/09/2020 FINDINGS: CTA CHEST FINDINGS Cardiovascular: Preferential opacification of the thoracic aorta. Normal contour and caliber of the thoracic aorta. No evidence of aneurysm, dissection, or other acute aortic pathology. Minimal atherosclerosis. Normal heart size. No pericardial effusion. Mediastinum/Nodes: No enlarged mediastinal, hilar, or axillary lymph nodes. Thyroid gland, trachea, and esophagus demonstrate no significant findings. Lungs/Pleura: Minimal centrilobular and paraseptal emphysema. Background of very fine centrilobular pulmonary nodularity, most concentrated in the lung apices. Diffuse bilateral bronchial wall thickening. No pleural effusion or pneumothorax. Musculoskeletal: No chest wall abnormality. No acute or significant osseous findings. Review of the MIP images confirms the above findings. CTA ABDOMEN AND PELVIS FINDINGS VASCULAR Normal contour and  caliber of the abdominal aorta. Evidence of aneurysm, dissection, or other acute aortic pathology. Standard branching pattern of the abdominal aorta, with solitary bilateral renal arteries. The branch vessel origins are patent. Mild mixed atherosclerosis. 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 NON-VASCULAR Hepatobiliary: No solid liver abnormality is seen. No gallstones, gallbladder wall thickening, or biliary dilatation. Pancreas: Unremarkable. No pancreatic ductal dilatation or surrounding inflammatory changes. Spleen: Normal in size without significant abnormality. Adrenals/Urinary Tract: Adrenal glands are unremarkable. Kidneys are normal, without renal calculi, solid lesion, or hydronephrosis. Bladder is unremarkable. Stomach/Bowel: There is a perforated ulceration of the posterior pylorus with adjacent inflammatory fat stranding and fluid (series 5, image 167). Appendix appears normal. No evidence of bowel wall thickening, distention, or inflammatory changes. Lymphatic: No enlarged abdominal or pelvic lymph nodes. Reproductive: No mass or other significant abnormality. Other: No abdominal wall hernia or abnormality. Pneumoperitoneum within the lesser and greater omental sacs. Musculoskeletal: No acute or significant osseous findings. Review of the MIP images confirms the above findings. IMPRESSION: 1. There is a perforated ulceration of the posterior pylorus with adjacent inflammatory fat stranding and fluid. Associated pneumoperitoneum. 2. No evidence of thoracic or abdominal aortic aneurysm, dissection, or other acute aortic pathology. Mild atherosclerosis. 3. 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 4. Minimal emphysema and diffuse  bilateral  bronchial wall thickening. 5. Background of very fine centrilobular pulmonary nodularity, most concentrated in the lung apices, most commonly seen in smoking-related respiratory bronchiolitis. Aortic Atherosclerosis (ICD10-I70.0) and Emphysema (ICD10-J43.9). Electronically Signed   By: Lauralyn Primes M.D.   On: 05/07/2021 13:44    Anti-infectives: Anti-infectives (From admission, onward)    Start     Dose/Rate Route Frequency Ordered Stop   05/08/21 1000  cefTRIAXone (ROCEPHIN) 2 g in sodium chloride 0.9 % 100 mL IVPB        2 g 200 mL/hr over 30 Minutes Intravenous Every 24 hours 05/07/21 2239 05/13/21 0959   05/07/21 2330  metroNIDAZOLE (FLAGYL) IVPB 500 mg        500 mg 100 mL/hr over 60 Minutes Intravenous Every 12 hours 05/07/21 2239 05/12/21 2329   05/07/21 1730  ceFEPIme (MAXIPIME) 2 g in sodium chloride 0.9 % 100 mL IVPB       See Hyperspace for full Linked Orders Report.   2 g 200 mL/hr over 30 Minutes Intravenous  Once 05/07/21 1720 05/07/21 1808   05/07/21 1730  metroNIDAZOLE (FLAGYL) IVPB 500 mg       See Hyperspace for full Linked Orders Report.   500 mg 100 mL/hr over 60 Minutes Intravenous  Once 05/07/21 1720 05/07/21 1847        Assessment/Plan Perforated ulcer in posterior pyloric channel POD#2 s/p Exploratory laparotomy, graham patch repair of perforated pyloric ulcer 9/20 Dr. Donell Beers - Continue NPO/NGT to LIWS - add IV protonix for bloody NG output, monitor h/h - Plan for UGI postop day 5 prior to considering PO - Continue PCA and IV tylenol for pain control - continue abx - check H pylori - mobilize, pulm toilet - replete K and check Mag level   ID - maxipime 9/20 x1, flagyl 9/20>>, rocephin 9/21>> FEN - IVF, NPO/NGT to LIWS VTE - lovenox Foley - d/c today when mobilizing   Tobacco abuse COPD   LOS: 2 days    Franne Forts, [facility] Surgery 05/09/2021, 9:16 AM Please see Amion for pager number during day hours  7:00am-4:30pm

## 2021-05-10 LAB — CBC
HCT: 47.9 % (ref 39.0–52.0)
Hemoglobin: 15.9 g/dL (ref 13.0–17.0)
MCH: 29.7 pg (ref 26.0–34.0)
MCHC: 33.2 g/dL (ref 30.0–36.0)
MCV: 89.5 fL (ref 80.0–100.0)
Platelets: 195 10*3/uL (ref 150–400)
RBC: 5.35 MIL/uL (ref 4.22–5.81)
RDW: 14.2 % (ref 11.5–15.5)
WBC: 14.6 10*3/uL — ABNORMAL HIGH (ref 4.0–10.5)
nRBC: 0 % (ref 0.0–0.2)

## 2021-05-10 LAB — BASIC METABOLIC PANEL
Anion gap: 6 (ref 5–15)
BUN: 11 mg/dL (ref 6–20)
CO2: 27 mmol/L (ref 22–32)
Calcium: 8.7 mg/dL — ABNORMAL LOW (ref 8.9–10.3)
Chloride: 100 mmol/L (ref 98–111)
Creatinine, Ser: 0.77 mg/dL (ref 0.61–1.24)
GFR, Estimated: >60 mL/min (ref >60)
Glucose, Bld: 110 mg/dL — ABNORMAL HIGH (ref 70–99)
Potassium: 3.7 mmol/L (ref 3.5–5.1)
Sodium: 133 mmol/L — ABNORMAL LOW (ref 135–145)

## 2021-05-10 LAB — H PYLORI, IGM, IGG, IGA AB
H Pylori IgG: 3.35 Index Value — ABNORMAL HIGH (ref 0.00–0.79)
H. Pylogi, Iga Abs: 16.8 units — ABNORMAL HIGH (ref 0.0–8.9)
H. Pylogi, Igm Abs: <9 units (ref 0.0–8.9)

## 2021-05-10 MED ORDER — ACETAMINOPHEN 10 MG/ML IV SOLN
1000.0000 mg | Freq: Four times a day (QID) | INTRAVENOUS | Status: AC
  Administered 2021-05-10 – 2021-05-11 (×3): 1000 mg via INTRAVENOUS
  Filled 2021-05-10 (×5): qty 100

## 2021-05-10 NOTE — Plan of Care (Signed)

## 2021-05-10 NOTE — Progress Notes (Signed)
Physical Therapy Treatment Patient Details Name: Jeffery [name] MRN: [id number] DOB: 08/02/1966 Today's Date: 05/10/2021   History of Present Illness 55yo male who presented on 9/20 with progressive abdominal pain. CT showed perforated pyloric ulcer. Received exploratory laparotomy and repair of pyloric channel ulcer 9/20. PMH COPD    PT Comments    Pt admitted with above diagnosis. Pt was able to ambulate in hall with supervision without device and complete steps.  Pt progressing well. Will follow acutely.  Pt currently with functional limitations due to balance and endurance deficits. Pt will benefit from skilled PT to increase their independence and safety with mobility to allow discharge to the venue listed below.      Recommendations for follow up therapy are one component of a multi-disciplinary discharge planning process, led by the attending physician.  Recommendations may be updated based on patient status, additional functional criteria and insurance authorization.  Follow Up Recommendations  No PT follow up     Equipment Recommendations  None recommended by PT    Recommendations for Other Services       Precautions / Restrictions Precautions Precautions: Other (comment) Precaution Comments: abdominal incision, NG tube Restrictions Weight Bearing Restrictions: No     Mobility  Bed Mobility Overal bed mobility: Needs Assistance Bed Mobility: Rolling;Sidelying to Sit Rolling: Supervision Sidelying to sit: Supervision            Transfers Overall transfer level: Needs assistance Equipment used: None Transfers: Sit to/from Stand Sit to Stand: Supervision         General transfer comment: S for safety/cues for hand placement  Ambulation/Gait Ambulation/Gait assistance: Supervision Gait Distance (Feet): 650 Feet Assistive device: None Gait Pattern/deviations: Step-through pattern;Decreased stride length Gait velocity: decreased Gait velocity  interpretation: <1.8 ft/sec, indicate of risk for recurrent falls General Gait Details: slow and steady gait without RW wtih no LOB.  Progressing today.   Stairs Stairs: Yes Stairs assistance: Supervision Stair Management: One rail Left;Forwards;Alternating pattern Number of Stairs: 3 General stair comments: No LOB on stairs   Wheelchair Mobility    Modified Rankin (Stroke Patients Only)       Balance Overall balance assessment: No apparent balance deficits (not formally assessed)                                          Cognition Arousal/Alertness: Awake/alert Behavior During Therapy: WFL for tasks assessed/performed Overall Cognitive Status: Within Functional Limits for tasks assessed                                        Exercises      General Comments General comments (skin integrity, edema, etc.): VSS on RA      Pertinent Vitals/Pain Pain Assessment: Faces Faces Pain Scale: Hurts a little bit Pain Location: stomach/surgery area Pain Descriptors / Indicators: Aching;Stabbing;Throbbing Pain Intervention(s): Limited activity within patient's tolerance;Monitored during session;Repositioned;PCA encouraged;Premedicated before session    Home Living                      Prior Function            PT Goals (current goals can now be found in the care plan section) Acute Rehab PT Goals Patient Stated Goal: less pain Progress towards PT goals: Progressing toward  goals    Frequency    Min 3X/week      PT Plan Discharge plan needs to be updated;Equipment recommendations need to be updated    Co-evaluation              AM-PAC PT "6 Clicks" Mobility   Outcome Measure  Help needed turning from your back to your side while in a flat bed without using bedrails?: None Help needed moving from lying on your back to sitting on the side of a flat bed without using bedrails?: None Help needed moving to and from a  bed to a chair (including a wheelchair)?: A Little Help needed standing up from a chair using your arms (e.g., wheelchair or bedside chair)?: A Little Help needed to walk in hospital room?: A Little Help needed climbing 3-5 steps with a railing? : A Little 6 Click Score: 20    End of Session   Activity Tolerance: Patient tolerated treatment well Patient left: in chair;with call bell/phone within reach Nurse Communication: Mobility status PT Visit Diagnosis: Pain;Other abnormalities of gait and mobility (R26.89);Difficulty in walking, not elsewhere classified (R26.2) Pain - Right/Left:  (midline) Pain - part of body:  (abdomen)     Time: 3419-6222 PT Time Calculation (min) (ACUTE ONLY): 18 min  Charges:  $Gait Training: 8-22 mins                     Jeffery [name] M,PT Acute Rehab Services [phone] (pager)    Jeffery [name] 05/10/2021, 11:42 AM

## 2021-05-10 NOTE — Progress Notes (Signed)
Patient ID: Jeffery [name], male   DOB: 1966-01-04, 55 y.o.   MRN: [id number]  [facility] Surgery Progress Note  3 Days Post-Op  Subjective: CC-  Abdomen sore at incision site but patient states that he has minimal pain - mostly just when he tries to sit up in bed. Using PCA very little. No flatus or BM. NG with 1800cc out last 24 hours.  Objective: Vital signs in last 24 hours: Temp:  [97.9 F (36.6 C)-98.4 F (36.9 C)] 98.4 F (36.9 C) (09/22 1950) Pulse Rate:  [65-97] 68 (09/22 1950) Resp:  [13-20] 13 (09/23 0751) BP: (139-143)/(83-94) 143/89 (09/22 1950) SpO2:  [94 %-98 %] 97 % (09/23 0751) FiO2 (%):  [95 %-97 %] 97 % (09/23 0751)    Intake/Output from previous day: 09/22 0701 - 09/23 0700 In: 3003.3 [P.O.:120; I.V.:1906.8; IV Piggyback:976.5] Out: 4000 [Urine:2200; Emesis/NG output:1800] Intake/Output this shift: No intake/output data recorded.  PE: Gen:  Alert, NAD, pleasant Card:  RRR Pulm: rate and effort normal Abd: Soft, ND, appropriately tender, hypoactive BS, midline incision cdi with staples present and no erythema or drainage  Lab Results:  Recent Labs    05/09/21 0034 05/10/21 0154  WBC 20.6* 14.6*  HGB 14.7 15.9  HCT 42.1 47.9  PLT 185 195   BMET Recent Labs    05/09/21 0034 05/10/21 0435  NA 136 133*  K 3.4* 3.7  CL 97* 100  CO2 32 27  GLUCOSE 109* 110*  BUN 14 11  CREATININE 0.87 0.77  CALCIUM 8.9 8.7*   PT/INR Recent Labs    05/08/21 0231  LABPROT 14.9  INR 1.2   CMP     Component Value Date/Time   NA 133 (L) 05/10/2021 0435   K 3.7 05/10/2021 0435   CL 100 05/10/2021 0435   CO2 27 05/10/2021 0435   GLUCOSE 110 (H) 05/10/2021 0435   BUN 11 05/10/2021 0435   CREATININE 0.77 05/10/2021 0435   CALCIUM 8.7 (L) 05/10/2021 0435   PROT 7.7 05/07/2021 0852   ALBUMIN 4.2 05/07/2021 0852   AST 23 05/07/2021 0852   ALT 24 05/07/2021 0852   ALKPHOS 96 05/07/2021 0852   BILITOT 3.3 (H) 05/07/2021 0852   GFRNONAA >60  05/10/2021 0435   GFRAA >60 02/01/2018 0828   Lipase     Component Value Date/Time   LIPASE 30 05/07/2021 0852       Studies/Results: No results found.  Anti-infectives: Anti-infectives (From admission, onward)    Start     Dose/Rate Route Frequency Ordered Stop   05/08/21 1000  cefTRIAXone (ROCEPHIN) 2 g in sodium chloride 0.9 % 100 mL IVPB        2 g 200 mL/hr over 30 Minutes Intravenous Every 24 hours 05/07/21 2239 05/13/21 0959   05/07/21 2330  metroNIDAZOLE (FLAGYL) IVPB 500 mg        500 mg 100 mL/hr over 60 Minutes Intravenous Every 12 hours 05/07/21 2239 05/12/21 2329   05/07/21 1730  ceFEPIme (MAXIPIME) 2 g in sodium chloride 0.9 % 100 mL IVPB       See Hyperspace for full Linked Orders Report.   2 g 200 mL/hr over 30 Minutes Intravenous  Once 05/07/21 1720 05/07/21 1808   05/07/21 1730  metroNIDAZOLE (FLAGYL) IVPB 500 mg       See Hyperspace for full Linked Orders Report.   500 mg 100 mL/hr over 60 Minutes Intravenous  Once 05/07/21 1720 05/07/21 1847  Assessment/Plan Perforated ulcer in posterior pyloric channel POD#3 s/p Exploratory laparotomy, graham patch repair of perforated pyloric ulcer 9/20 Dr. Donell Beers - Continue NPO/NGT to LIWS - IV protonix for blood-tinged NG output, h/h stable - Plan for UGI postop day 5 prior to considering PO - Continue PCA and IV tylenol for pain control - continue abx - H pylori pending - mobilize, pulm toilet   ID - maxipime 9/20 x1, flagyl 9/20>>, rocephin 9/21>> FEN - IVF, NPO/NGT to LIWS VTE - lovenox Foley - out and voiding   Tobacco abuse COPD Hyperglycemia - A1c 5.7   LOS: 3 days    Franne Forts, [facility] Surgery 05/10/2021, 8:36 AM Please see Amion for pager number during day hours 7:00am-4:30pm

## 2021-05-11 LAB — BASIC METABOLIC PANEL
Anion gap: 7 (ref 5–15)
BUN: 9 mg/dL (ref 6–20)
CO2: 25 mmol/L (ref 22–32)
Calcium: 8.9 mg/dL (ref 8.9–10.3)
Chloride: 103 mmol/L (ref 98–111)
Creatinine, Ser: 0.72 mg/dL (ref 0.61–1.24)
GFR, Estimated: >60 mL/min (ref >60)
Glucose, Bld: 114 mg/dL — ABNORMAL HIGH (ref 70–99)
Potassium: 3.6 mmol/L (ref 3.5–5.1)
Sodium: 135 mmol/L (ref 135–145)

## 2021-05-11 LAB — CBC
HCT: 41.5 % (ref 39.0–52.0)
Hemoglobin: 14 g/dL (ref 13.0–17.0)
MCH: 29.6 pg (ref 26.0–34.0)
MCHC: 33.7 g/dL (ref 30.0–36.0)
MCV: 87.7 fL (ref 80.0–100.0)
Platelets: 226 10*3/uL (ref 150–400)
RBC: 4.73 MIL/uL (ref 4.22–5.81)
RDW: 13 % (ref 11.5–15.5)
WBC: 9.9 10*3/uL (ref 4.0–10.5)
nRBC: 0 % (ref 0.0–0.2)

## 2021-05-11 LAB — MAGNESIUM: Magnesium: 1.8 mg/dL (ref 1.7–2.4)

## 2021-05-11 MED ORDER — MORPHINE SULFATE (PF) 2 MG/ML IV SOLN
2.0000 mg | INTRAVENOUS | Status: DC | PRN
  Administered 2021-05-11 – 2021-05-13 (×3): 2 mg via INTRAVENOUS
  Filled 2021-05-11 (×3): qty 1

## 2021-05-11 NOTE — Progress Notes (Signed)
4 Days Post-Op   Subjective/Chief Complaint: No complaints - not really using PCA Sore around incision when moving No nausea or vomiting   Objective: Vital signs in last 24 hours: Temp:  [97.8 F (36.6 C)-98.4 F (36.9 C)] 98.4 F (36.9 C) (09/24 0817) Pulse Rate:  [62-67] 62 (09/24 0817) Resp:  [14-21] 18 (09/24 0914) BP: (150-166)/(93-99) 150/96 (09/24 0817) SpO2:  [94 %-100 %] 95 % (09/24 0914) FiO2 (%):  [21 %] 21 % (09/23 1613) Weight:  [61.1 kg] 61.1 kg (09/24 0500) Last BM Date: 05/09/21  Intake/Output from previous day: 09/23 0701 - 09/24 0700 In: 2941.6 [I.V.:2441.2; IV Piggyback:500.3] Out: 2350 [Urine:1650; Emesis/NG output:700] Intake/Output this shift: No intake/output data recorded.  General appearance: alert, cooperative, and no distress GI: soft, minimal tenderness Incision c/d/i  Lab Results:  Recent Labs    05/10/21 0154 05/11/21 0426  WBC 14.6* 9.9  HGB 15.9 14.0  HCT 47.9 41.5  PLT 195 226   BMET Recent Labs    05/10/21 0435 05/11/21 0426  NA 133* 135  K 3.7 3.6  CL 100 103  CO2 27 25  GLUCOSE 110* 114*  BUN 11 9  CREATININE 0.77 0.72  CALCIUM 8.7* 8.9   PT/INR No results for input(s): LABPROT, INR in the last 72 hours. ABG No results for input(s): PHART, HCO3 in the last 72 hours.  Invalid input(s): PCO2, PO2  Studies/Results: No results found.  Anti-infectives: Anti-infectives (From admission, onward)    Start     Dose/Rate Route Frequency Ordered Stop   05/08/21 1000  cefTRIAXone (ROCEPHIN) 2 g in sodium chloride 0.9 % 100 mL IVPB        2 g 200 mL/hr over 30 Minutes Intravenous Every 24 hours 05/07/21 2239 05/13/21 0959   05/07/21 2330  metroNIDAZOLE (FLAGYL) IVPB 500 mg        500 mg 100 mL/hr over 60 Minutes Intravenous Every 12 hours 05/07/21 2239 05/12/21 2329   05/07/21 1730  ceFEPIme (MAXIPIME) 2 g in sodium chloride 0.9 % 100 mL IVPB       See Hyperspace for full Linked Orders Report.   2 g 200 mL/hr over  30 Minutes Intravenous  Once 05/07/21 1720 05/07/21 1808   05/07/21 1730  metroNIDAZOLE (FLAGYL) IVPB 500 mg       See Hyperspace for full Linked Orders Report.   500 mg 100 mL/hr over 60 Minutes Intravenous  Once 05/07/21 1720 05/07/21 1847       Assessment/Plan: Perforated ulcer in posterior pyloric channel POD#4 s/p Exploratory laparotomy, graham patch repair of perforated pyloric ulcer 9/20 Dr. Donell Beers - Continue NPO/NGT to LIWS - IV protonix for blood-tinged NG output, h/h stable - Plan for UGI postop 05/12/21 prior to removing NG tube - D/C pCA - continue abx - H pylori pending - mobilize, pulm toilet   ID - maxipime 9/20 x1, flagyl 9/20>>, rocephin 9/21>> FEN - IVF, NPO/NGT to LIWS VTE - lovenox Foley - out and voiding   Tobacco abuse COPD Hyperglycemia - A1c 5.7  LOS: 4 days    Wynona Luna 05/11/2021

## 2021-05-11 NOTE — Plan of Care (Signed)

## 2021-05-11 NOTE — Plan of Care (Signed)

## 2021-05-12 ENCOUNTER — Inpatient Hospital Stay (HOSPITAL_COMMUNITY): Payer: Self-pay

## 2021-05-12 LAB — CBC
HCT: 43.7 % (ref 39.0–52.0)
Hemoglobin: 15.2 g/dL (ref 13.0–17.0)
MCH: 30 pg (ref 26.0–34.0)
MCHC: 34.8 g/dL (ref 30.0–36.0)
MCV: 86.4 fL (ref 80.0–100.0)
Platelets: 258 10*3/uL (ref 150–400)
RBC: 5.06 MIL/uL (ref 4.22–5.81)
RDW: 12.9 % (ref 11.5–15.5)
WBC: 9.3 10*3/uL (ref 4.0–10.5)
nRBC: 0 % (ref 0.0–0.2)

## 2021-05-12 LAB — BASIC METABOLIC PANEL
Anion gap: 8 (ref 5–15)
BUN: 11 mg/dL (ref 6–20)
CO2: 26 mmol/L (ref 22–32)
Calcium: 9.2 mg/dL (ref 8.9–10.3)
Chloride: 101 mmol/L (ref 98–111)
Creatinine, Ser: 0.85 mg/dL (ref 0.61–1.24)
GFR, Estimated: >60 mL/min (ref >60)
Glucose, Bld: 108 mg/dL — ABNORMAL HIGH (ref 70–99)
Potassium: 4.1 mmol/L (ref 3.5–5.1)
Sodium: 135 mmol/L (ref 135–145)

## 2021-05-12 NOTE — Plan of Care (Signed)
  Problem: Education: Goal: Knowledge of General Education information will improve Description: Including pain rating scale, medication(s)/side effects and non-pharmacologic comfort measures Outcome: Progressing   Problem: Activity: Goal: Risk for activity intolerance will decrease Outcome: Progressing   Problem: Elimination: Goal: Will not experience complications related to bowel motility Outcome: Progressing   Problem: Safety: Goal: Ability to remain free from injury will improve Outcome: Progressing   Problem: Skin Integrity: Goal: Risk for impaired skin integrity will decrease Outcome: Progressing   

## 2021-05-12 NOTE — Progress Notes (Signed)
5 Days Post-Op   Subjective/Chief Complaint: Upper GI still has not been performed by radiology, although it was scheduled on Friday. No complaints No nausea or vomiting Decreasing NG output   Objective: Vital signs in last 24 hours: Temp:  [98.6 F (37 C)] 98.6 F (37 C) (09/24 2107) Pulse Rate:  [70] 70 (09/24 2107) Resp:  [16] 16 (09/24 2107) BP: (156-161)/(91-98) 156/91 (09/24 2315) SpO2:  [97 %] 97 % (09/24 2107) Last BM Date: 05/09/21  Intake/Output from previous day: 09/24 0701 - 09/25 0700 In: 1541.3 [I.V.:1201.3; NG/GT:40; IV Piggyback:300] Out: 2780 [Urine:1780; Emesis/NG output:1000] Intake/Output this shift: Total I/O In: -  Out: 1200 [Urine:800; Emesis/NG output:400]  General appearance: alert, cooperative, and no distress GI: soft, minimal tenderness Incision c/d/i  Lab Results:  Recent Labs    05/11/21 0426 05/12/21 0410  WBC 9.9 9.3  HGB 14.0 15.2  HCT 41.5 43.7  PLT 226 258   BMET Recent Labs    05/11/21 0426 05/12/21 0410  NA 135 135  K 3.6 4.1  CL 103 101  CO2 25 26  GLUCOSE 114* 108*  BUN 9 11  CREATININE 0.72 0.85  CALCIUM 8.9 9.2   PT/INR No results for input(s): LABPROT, INR in the last 72 hours. ABG No results for input(s): PHART, HCO3 in the last 72 hours.  Invalid input(s): PCO2, PO2  Studies/Results: No results found.  Anti-infectives: Anti-infectives (From admission, onward)    Start     Dose/Rate Route Frequency Ordered Stop   05/08/21 1000  cefTRIAXone (ROCEPHIN) 2 g in sodium chloride 0.9 % 100 mL IVPB        2 g 200 mL/hr over 30 Minutes Intravenous Every 24 hours 05/07/21 2239 05/12/21 0955   05/07/21 2330  metroNIDAZOLE (FLAGYL) IVPB 500 mg        500 mg 100 mL/hr over 60 Minutes Intravenous Every 12 hours 05/07/21 2239 05/12/21 2329   05/07/21 1730  ceFEPIme (MAXIPIME) 2 g in sodium chloride 0.9 % 100 mL IVPB       See Hyperspace for full Linked Orders Report.   2 g 200 mL/hr over 30 Minutes Intravenous   Once 05/07/21 1720 05/07/21 1808   05/07/21 1730  metroNIDAZOLE (FLAGYL) IVPB 500 mg       See Hyperspace for full Linked Orders Report.   500 mg 100 mL/hr over 60 Minutes Intravenous  Once 05/07/21 1720 05/07/21 1847       Assessment/Plan: Perforated ulcer in posterior pyloric channel POD #5 s/p Exploratory laparotomy, graham patch repair of perforated pyloric ulcer 9/20 Dr. Donell Beers - Upper GI today - if OK, then D/C NG tube and begin clears - D/C pCA - continue abx - H pylori pending - mobilize, pulm toilet   ID - maxipime 9/20 x1, flagyl 9/20>>, rocephin 9/21>> FEN - IVF, NPO/NGT to LIWS until UGI completed VTE - lovenox Foley - out and voiding   Tobacco abuse COPD Hyperglycemia - A1c 5.7  LOS: 5 days    Jeffery [name] 05/12/2021

## 2021-05-12 NOTE — Plan of Care (Signed)
  Problem: Education: Goal: Knowledge of General Education information will improve Description: Including pain rating scale, medication(s)/side effects and non-pharmacologic comfort measures Outcome: Progressing   Problem: Clinical Measurements: Goal: Ability to maintain clinical measurements within normal limits will improve Outcome: Progressing   

## 2021-05-13 MED ORDER — BISMUTH SUBSALICYLATE 262 MG/15ML PO SUSP
30.0000 mL | Freq: Three times a day (TID) | ORAL | Status: DC
  Administered 2021-05-14 (×2): 30 mL via ORAL
  Filled 2021-05-13 (×2): qty 236

## 2021-05-13 MED ORDER — METRONIDAZOLE 500 MG PO TABS
500.0000 mg | ORAL_TABLET | Freq: Two times a day (BID) | ORAL | Status: DC
  Administered 2021-05-13 – 2021-05-14 (×3): 500 mg via ORAL
  Filled 2021-05-13 (×3): qty 1

## 2021-05-13 MED ORDER — PANTOPRAZOLE SODIUM 40 MG PO TBEC
40.0000 mg | DELAYED_RELEASE_TABLET | Freq: Two times a day (BID) | ORAL | Status: DC
  Administered 2021-05-13 – 2021-05-14 (×3): 40 mg via ORAL
  Filled 2021-05-13 (×3): qty 1

## 2021-05-13 MED ORDER — ENSURE ENLIVE PO LIQD
237.0000 mL | Freq: Two times a day (BID) | ORAL | Status: DC
  Administered 2021-05-13: 237 mL via ORAL

## 2021-05-13 MED ORDER — TETRACYCLINE HCL 250 MG PO CAPS
500.0000 mg | ORAL_CAPSULE | Freq: Four times a day (QID) | ORAL | Status: DC
  Administered 2021-05-13 – 2021-05-14 (×5): 500 mg via ORAL
  Filled 2021-05-13 (×6): qty 2

## 2021-05-13 MED ORDER — ACETAMINOPHEN 325 MG PO TABS: 650.0000 mg | ORAL_TABLET | Freq: Four times a day (QID) | ORAL | Status: DC | PRN

## 2021-05-13 MED ORDER — OXYCODONE HCL 5 MG PO TABS
5.0000 mg | ORAL_TABLET | ORAL | Status: DC | PRN
  Administered 2021-05-14: 5 mg via ORAL
  Filled 2021-05-13: qty 1

## 2021-05-13 MED ORDER — METHOCARBAMOL 500 MG PO TABS: 500.0000 mg | ORAL_TABLET | Freq: Four times a day (QID) | ORAL | Status: DC | PRN

## 2021-05-13 NOTE — Progress Notes (Signed)
PT Cancellation Note  Patient Details Name: Jeffery [name] MRN: [id number] DOB: [birth year]   Cancelled Treatment:    Reason Eval/Treat Not Completed: Other (comment) (Pt ambulating without assist.  No longer needs PT.  D/C PT)   [name] 05/13/2021, 10:40 AM Wilmar Prabhakar M,PT Acute Rehab Services [phone] (pager)

## 2021-05-13 NOTE — Progress Notes (Signed)
Physical Therapy Discharge Patient Details Name: Jeffery [name] MRN: [id number] DOB: Dec 28, 1965 Today's Date: 05/13/2021 Time:  -     Patient discharged from PT services secondary to goals met and no further PT needs identified.  Please see latest therapy progress note for current level of functioning and progress toward goals.    Progress and discharge plan discussed with patient and/or caregiver: Patient/Caregiver agrees with plan  GP     Alvira Philips 05/13/2021, 10:40 AM  Amika Tassin M,PT Acute Rehab Services [phone] (pager)

## 2021-05-13 NOTE — Progress Notes (Signed)
6 Days Post-Op   Subjective/Chief Complaint: No complaints. Pain controlled. Mobilizing. Tolerating clears (two full trays so far). Endorses flatus and liquid BMs. Denies pain with PO intake. Denies nausea.    Objective: Vital signs in last 24 hours: Temp:  [98 F (36.7 C)-98.3 F (36.8 C)] 98 F (36.7 C) (09/26 0721) Pulse Rate:  [63-68] 63 (09/26 0721) Resp:  [15-16] 15 (09/26 0721) BP: (146-148)/(88-104) 148/104 (09/26 0721) SpO2:  [97 %-99 %] 99 % (09/26 0721) Last BM Date: 05/12/21  Intake/Output from previous day: 09/25 0701 - 09/26 0700 In: 500 [P.O.:100; I.V.:400] Out: 1700 [Urine:800; Emesis/NG output:900] Intake/Output this shift: No intake/output data recorded.  General appearance: alert, cooperative, and no distress GI: soft, minimal tenderness Incision c/d/i  Lab Results:  Recent Labs    05/11/21 0426 05/12/21 0410  WBC 9.9 9.3  HGB 14.0 15.2  HCT 41.5 43.7  PLT 226 258   BMET Recent Labs    05/11/21 0426 05/12/21 0410  NA 135 135  K 3.6 4.1  CL 103 101  CO2 25 26  GLUCOSE 114* 108*  BUN 9 11  CREATININE 0.72 0.85  CALCIUM 8.9 9.2   PT/INR No results for input(s): LABPROT, INR in the last 72 hours. ABG No results for input(s): PHART, HCO3 in the last 72 hours.  Invalid input(s): PCO2, PO2  Studies/Results: DG UGI W SINGLE CM (SOL OR THIN BA)  Result Date: 05/12/2021 CLINICAL DATA:  Perforated gastric ulcer. Status post exploratory laparotomy. EXAM: WATER SOLUBLE UPPER GI SERIES TECHNIQUE: Single-column upper GI series was performed using water soluble contrast. CONTRAST:  25 mL of Omnipaque 300 intravenously. COMPARISON:  May 07, 2021. FLUOROSCOPY TIME:  Radiation Exposure Index (if provided by the fluoroscopic device): 20.9 mGy. FINDINGS: Nasogastric tube tip is seen in proximal stomach. Contrast filling of nondilated stomach is noted. Contrast flowed from the stomach into the proximal duodenum. No definite evidence of extravasation  or leakage is noted. IMPRESSION: No definite evidence of contrast extravasation or leakage is noted. Electronically Signed   By: Lupita Raider M.D.   On: 05/12/2021 13:42    Anti-infectives: Anti-infectives (From admission, onward)    Start     Dose/Rate Route Frequency Ordered Stop   05/13/21 1000  tetracycline (SUMYCIN) capsule 500 mg        500 mg Oral 4 times daily 05/13/21 0728 05/27/21 0959   05/13/21 1000  metroNIDAZOLE (FLAGYL) tablet 500 mg        500 mg Oral Every 12 hours 05/13/21 0728 05/27/21 0959   05/08/21 1000  cefTRIAXone (ROCEPHIN) 2 g in sodium chloride 0.9 % 100 mL IVPB        2 g 200 mL/hr over 30 Minutes Intravenous Every 24 hours 05/07/21 2239 05/12/21 0955   05/07/21 2330  metroNIDAZOLE (FLAGYL) IVPB 500 mg        500 mg 100 mL/hr over 60 Minutes Intravenous Every 12 hours 05/07/21 2239 05/12/21 1153   05/07/21 1730  ceFEPIme (MAXIPIME) 2 g in sodium chloride 0.9 % 100 mL IVPB       See Hyperspace for full Linked Orders Report.   2 g 200 mL/hr over 30 Minutes Intravenous  Once 05/07/21 1720 05/07/21 1808   05/07/21 1730  metroNIDAZOLE (FLAGYL) IVPB 500 mg       See Hyperspace for full Linked Orders Report.   500 mg 100 mL/hr over 60 Minutes Intravenous  Once 05/07/21 1720 05/07/21 1847       Assessment/Plan: Perforated  ulcer in posterior pyloric channel POD #6 s/p Exploratory laparotomy, graham patch repair of perforated pyloric ulcer 9/20 Dr. Donell Beers - Upper GI 9/25 without leak - tolerating  clears, advance diet to FLD - H pylori positive, start quadruple therapy today - mobilize, pulm toilet   ID - maxipime 9/20 x1, flagyl 9/20>>, rocephin 9/21>> FEN - advance to FLD, saline lock IV. Likely advance to soft diet this afternoon vs tomorrow. VTE - lovenox Foley - out and voiding   Tobacco abuse COPD Hyperglycemia - A1c 5.7  Dispo - med surg, anticipate hospital discharge within 24 hours.   LOS: 6 days    Jeffery [name] 05/13/2021

## 2021-05-14 ENCOUNTER — Other Ambulatory Visit (HOSPITAL_COMMUNITY): Payer: Self-pay

## 2021-05-14 LAB — CREATININE, SERUM
Creatinine, Ser: 0.78 mg/dL (ref 0.61–1.24)
GFR, Estimated: >60 mL/min (ref >60)

## 2021-05-14 MED ORDER — BISMUTH SUBSALICYLATE 262 MG/15ML PO SUSP: 30.0000 mL | Freq: Three times a day (TID) | ORAL | 0 refills | Status: DC

## 2021-05-14 MED ORDER — TETRACYCLINE HCL 500 MG PO CAPS
500.0000 mg | ORAL_CAPSULE | Freq: Four times a day (QID) | ORAL | 0 refills | Status: AC
  Filled 2021-05-14: qty 52, 13d supply, fill #0

## 2021-05-14 MED ORDER — WITCH HAZEL-GLYCERIN EX PADS
MEDICATED_PAD | CUTANEOUS | Status: DC | PRN
  Filled 2021-05-14: qty 100

## 2021-05-14 MED ORDER — BISMUTH SUBSALICYLATE 262 MG PO CHEW
1.0000 | CHEWABLE_TABLET | Freq: Three times a day (TID) | ORAL | 0 refills | Status: AC
  Filled 2021-05-14: qty 52, 13d supply, fill #0

## 2021-05-14 MED ORDER — METRONIDAZOLE 500 MG PO TABS: 500.0000 mg | ORAL_TABLET | Freq: Two times a day (BID) | ORAL | 0 refills | Status: DC

## 2021-05-14 MED ORDER — TETRACYCLINE HCL 500 MG PO CAPS: 500.0000 mg | ORAL_CAPSULE | Freq: Four times a day (QID) | ORAL | 0 refills | Status: DC

## 2021-05-14 MED ORDER — METRONIDAZOLE 500 MG PO TABS
500.0000 mg | ORAL_TABLET | Freq: Two times a day (BID) | ORAL | 0 refills | Status: AC
  Filled 2021-05-14: qty 26, 13d supply, fill #0

## 2021-05-14 MED ORDER — PANTOPRAZOLE SODIUM 40 MG PO TBEC
40.0000 mg | DELAYED_RELEASE_TABLET | Freq: Two times a day (BID) | ORAL | 1 refills | Status: AC
  Filled 2021-05-14: qty 28, 14d supply, fill #0

## 2021-05-14 MED ORDER — PANTOPRAZOLE SODIUM 40 MG PO TBEC: 40.0000 mg | DELAYED_RELEASE_TABLET | Freq: Two times a day (BID) | ORAL | 1 refills | Status: DC

## 2021-05-14 MED ORDER — WITCH HAZEL-GLYCERIN EX PADS
MEDICATED_PAD | CUTANEOUS | 3 refills | Status: AC | PRN
  Filled 2021-05-14: qty 40, 30d supply, fill #0

## 2021-05-14 NOTE — Plan of Care (Signed)
  Problem: Clinical Measurements: Goal: Ability to maintain clinical measurements within normal limits will improve Outcome: Progressing   

## 2021-05-29 NOTE — Discharge Summary (Signed)
[facility] Discharge Summary   Patient ID: Jeffery [name] MRN: [id number] DOB/AGE: [birth year] 55 y.o.  Admit date: 05/07/2021 Discharge date: 19/27/2022  Admitting Diagnosis: Perforated ulcer  Discharge Diagnosis Patient Active Problem List   Diagnosis Date Noted   Observation after surgery 05/07/2021   Perforated ulcer (HCC) 05/07/2021  H.pylori   Consultants None   Imaging: EXAM: CT ANGIOGRAPHY CHEST, ABDOMEN AND PELVIS  IMPRESSION: 1. There is a perforated ulceration of the posterior pylorus with adjacent inflammatory fat stranding and fluid. Associated pneumoperitoneum. 2. No evidence of thoracic or abdominal aortic aneurysm, dissection, or other acute aortic pathology. Mild atherosclerosis. 3. 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 4. Minimal emphysema and diffuse bilateral bronchial wall thickening. 5. Background of very fine centrilobular pulmonary nodularity, most concentrated in the lung apices, most commonly seen in smoking-related respiratory bronchiolitis.    EXAM: WATER SOLUBLE UPPER GI SERIES   TECHNIQUE: Single-column upper GI series was performed using water soluble contrast.   CONTRAST:  25 mL of Omnipaque 300 intravenously.   COMPARISON:  May 07, 2021.   FLUOROSCOPY TIME:  Radiation Exposure Index (if provided by the fluoroscopic device): 20.9 mGy.   FINDINGS: Nasogastric tube tip is seen in proximal stomach. Contrast filling of nondilated stomach is noted. Contrast flowed from the stomach into the proximal duodenum. No definite evidence of extravasation or leakage is noted.   IMPRESSION: No definite evidence of contrast extravasation or leakage is noted. Procedures Dr. Almond Lint (05/07/21) - ex lap, graham patch repair pyloric channel ulcer   Hospital Course:    Patient is a 55 year old male who came to the emergency department this morning with 4 days of worsening abdominal pain.  He describes the pain as starting out feeling like heart attack.  The pain then migrated lower in his abdomen.  He now has pain all over his abdomen.  He does not recall an incidence where the pain suddenly got worse, but it has been unbearable now for over 24 hours.  He has tried over-the-counter pain medication and Tums to no avail.  He denies any history of ulcers of which he is aware.  He has never had an endoscopy.  He denies taking any significant quantities of BC powders, Aleve, ibuprofen, or other anti-inflammatory medications.  He works in [employer].  He has never had pain like this before.   He thought it might be kidney stones as he has had pain in the past.  Although this was not exactly the same, that was the first thought.  Initial CT renal protocol was not revealing.  He continued to have ear pain so a CT angiogram was performed to evaluate for dissection.  The CT then showed a perforated pyloric ulcer.  He was taken emergently for the above procedure by Dr. Donell Beers and then admitted to the floor for further management with NG tube in place. He underwent UGI study as above on POD#5 which was negative for post-op leak. Bowel function returned and diet was gradually advanced as tolerated. His h.pylori test came back positive and he was started on quadruple therapy. On 05/14/21 patients vitals were stable, tolerating PO, pain controlled, having bowel function and felt stable for discharge home. Follow up as below.  I have personally reviewed the patients medication history on the [place] controlled substance database.   Physical Exam: General:  Alert, NAD, pleasant, comfortable Abd:  Soft, ND, NT, incisions C/D/I  Allergies as of 05/14/2021       Reactions   Amoxicillin Rash   Has patient had a PCN reaction causing immediate rash, facial/tongue/throat swelling, SOB or  lightheadedness with hypotension: Yes Has patient had a PCN reaction causing severe rash involving mucus membranes or skin necrosis: No Has patient had a PCN reaction that required hospitalization: No Has patient had a PCN reaction occurring within the last 10 years: No If all of the above answers are "NO", then may proceed with Cephalosporin use.        Medication List     STOP taking these medications    aspirin EC 81 MG tablet   ondansetron 4 MG disintegrating tablet Commonly known as: Zofran ODT   oxyCODONE-acetaminophen 5-325 MG tablet Commonly known as: PERCOCET/ROXICET       TAKE these medications    A.E.R. Witch Hazel pad Generic drug: witch hazel-glycerin Apply topically as needed for itching.   acetaminophen 500 MG tablet Commonly known as: TYLENOL Take 500-1,000 mg by mouth every 6 (six) hours as needed for mild pain or headache.   pantoprazole 40 MG tablet Commonly known as: PROTONIX Take 1 tablet (40 mg total) by mouth 2 (two) times daily for 14 days.       ASK your doctor about these medications    metroNIDAZOLE 500 MG tablet Commonly known as: FLAGYL Take 1 tablet (500 mg total) by mouth every 12 (twelve) hours for 13 days. Ask about: Should I take this medication?   SM Stomach Relief 262 MG chewable tablet Generic drug: bismuth subsalicylate Chew 1 tablet (262 mg total) by mouth 4 (four) times daily -  before meals and at bedtime for 13 days. Ask about: Should I take this medication?   tetracycline 500 MG capsule Commonly known as: SUMYCIN Take 1 capsule (500 mg total) by mouth 4 (four) times daily for 13 days. Ask about: Should I take this medication?          Follow-up Information     Almond Lint, MD. Go on 06/04/2021.   Specialty: General Surgery Why: at 9:00 AM for post-operative follow up. please arrive 15 minutes early. Contact information: [address] [place] [address] [phone]         [facility]. Go on 05/21/2021.   Why: at 2:00 PM for staple removal by a nurse. please arrive 20-30 minutes early to get checked in and fill out any necessay paperwork. Contact information: [address] [place] [address]                Signed: Hosie Spangle, [facility] 05/29/2021, 3:53 PM

## 2022-09-22 ENCOUNTER — Other Ambulatory Visit (HOSPITAL_BASED_OUTPATIENT_CLINIC_OR_DEPARTMENT_OTHER): Payer: Self-pay

## 2022-10-04 ENCOUNTER — Other Ambulatory Visit: Payer: Self-pay

## 2022-10-04 ENCOUNTER — Emergency Department (HOSPITAL_COMMUNITY)
Admission: EM | Admit: 2022-10-04 | Discharge: 2022-10-04 | Disposition: A | Discharge status: Home / Self Care | Payer: Self-pay | Attending: Emergency Medicine | Admitting: Emergency Medicine

## 2022-10-04 ENCOUNTER — Encounter (HOSPITAL_COMMUNITY): Payer: Self-pay

## 2022-10-04 ENCOUNTER — Emergency Department (HOSPITAL_COMMUNITY): Payer: Self-pay

## 2022-10-04 DIAGNOSIS — S61313A Laceration without foreign body of left middle finger with damage to nail, initial encounter: Secondary | ICD-10-CM

## 2022-10-04 DIAGNOSIS — Z23 Encounter for immunization: Secondary | ICD-10-CM | POA: Insufficient documentation

## 2022-10-04 DIAGNOSIS — W312XXA Contact with powered woodworking and forming machines, initial encounter: Secondary | ICD-10-CM | POA: Insufficient documentation

## 2022-10-04 DIAGNOSIS — S61311A Laceration without foreign body of left index finger with damage to nail, initial encounter: Secondary | ICD-10-CM

## 2022-10-04 DIAGNOSIS — F17200 Nicotine dependence, unspecified, uncomplicated: Secondary | ICD-10-CM | POA: Insufficient documentation

## 2022-10-04 DIAGNOSIS — S61209A Unspecified open wound of unspecified finger without damage to nail, initial encounter: Secondary | ICD-10-CM

## 2022-10-04 DIAGNOSIS — S66321A Laceration of extensor muscle, fascia and tendon of left index finger at wrist and hand level, initial encounter: Secondary | ICD-10-CM | POA: Insufficient documentation

## 2022-10-04 DIAGNOSIS — Y9389 Activity, other specified: Secondary | ICD-10-CM | POA: Insufficient documentation

## 2022-10-04 DIAGNOSIS — S61213A Laceration without foreign body of left middle finger without damage to nail, initial encounter: Secondary | ICD-10-CM | POA: Insufficient documentation

## 2022-10-04 DIAGNOSIS — S61012A Laceration without foreign body of left thumb without damage to nail, initial encounter: Secondary | ICD-10-CM | POA: Insufficient documentation

## 2022-10-04 LAB — BASIC METABOLIC PANEL
Anion gap: 9 (ref 5–15)
BUN: 16 mg/dL (ref 6–20)
CO2: 26 mmol/L (ref 22–32)
Calcium: 9.1 mg/dL (ref 8.9–10.3)
Chloride: 103 mmol/L (ref 98–111)
Creatinine, Ser: 0.94 mg/dL (ref 0.61–1.24)
GFR, Estimated: >60 mL/min (ref >60)
Glucose, Bld: 119 mg/dL — ABNORMAL HIGH (ref 70–99)
Potassium: 3.3 mmol/L — ABNORMAL LOW (ref 3.5–5.1)
Sodium: 138 mmol/L (ref 135–145)

## 2022-10-04 LAB — CBC WITH DIFFERENTIAL/PLATELET
Abs Immature Granulocytes: 0.05 10*3/uL (ref 0.00–0.07)
Basophils Absolute: 0.1 10*3/uL (ref 0.0–0.1)
Basophils Relative: 1 %
Eosinophils Absolute: 0.1 10*3/uL (ref 0.0–0.5)
Eosinophils Relative: 1 %
HCT: 44.6 % (ref 39.0–52.0)
Hemoglobin: 14.7 g/dL (ref 13.0–17.0)
Immature Granulocytes: 1 %
Lymphocytes Relative: 27 %
Lymphs Abs: 3 10*3/uL (ref 0.7–4.0)
MCH: 29.2 pg (ref 26.0–34.0)
MCHC: 33 g/dL (ref 30.0–36.0)
MCV: 88.5 fL (ref 80.0–100.0)
Monocytes Absolute: 1.1 10*3/uL — ABNORMAL HIGH (ref 0.1–1.0)
Monocytes Relative: 10 %
Neutro Abs: 6.7 10*3/uL (ref 1.7–7.7)
Neutrophils Relative %: 60 %
Platelets: 248 10*3/uL (ref 150–400)
RBC: 5.04 MIL/uL (ref 4.22–5.81)
RDW: 13.6 % (ref 11.5–15.5)
WBC: 11 10*3/uL — ABNORMAL HIGH (ref 4.0–10.5)
nRBC: 0 % (ref 0.0–0.2)

## 2022-10-04 MED ORDER — OXYCODONE-ACETAMINOPHEN 5-325 MG PO TABS: 1.0000 | ORAL_TABLET | Freq: Four times a day (QID) | ORAL | 0 refills | Status: AC | PRN

## 2022-10-04 MED ORDER — TETANUS-DIPHTH-ACELL PERTUSSIS 5-2.5-18.5 LF-MCG/0.5 IM SUSY
0.5000 mL | PREFILLED_SYRINGE | Freq: Once | INTRAMUSCULAR | Status: AC
  Administered 2022-10-04: 0.5 mL via INTRAMUSCULAR
  Filled 2022-10-04: qty 0.5

## 2022-10-04 MED ORDER — BUPIVACAINE HCL (PF) 0.5 % IJ SOLN
20.0000 mL | Freq: Once | INTRAMUSCULAR | Status: AC
  Administered 2022-10-04: 20 mL
  Filled 2022-10-04: qty 30

## 2022-10-04 MED ORDER — SODIUM CHLORIDE 0.9 % IV BOLUS
1000.0000 mL | Freq: Once | INTRAVENOUS | Status: AC
  Administered 2022-10-04: 1000 mL via INTRAVENOUS

## 2022-10-04 MED ORDER — HYDROMORPHONE HCL 1 MG/ML IJ SOLN
0.5000 mg | Freq: Once | INTRAMUSCULAR | Status: AC
  Administered 2022-10-04: 0.5 mg via INTRAVENOUS
  Filled 2022-10-04: qty 1

## 2022-10-04 MED ORDER — CLINDAMYCIN HCL 150 MG PO CAPS: 300.0000 mg | ORAL_CAPSULE | Freq: Four times a day (QID) | ORAL | 0 refills | Status: AC

## 2022-10-04 MED ORDER — CLINDAMYCIN PHOSPHATE 900 MG/50ML IV SOLN
900.0000 mg | INTRAVENOUS | Status: AC
  Administered 2022-10-04: 900 mg via INTRAVENOUS
  Filled 2022-10-04: qty 50

## 2022-10-04 MED ORDER — ONDANSETRON HCL 4 MG/2ML IJ SOLN
4.0000 mg | Freq: Once | INTRAMUSCULAR | Status: AC
  Administered 2022-10-04: 4 mg via INTRAVENOUS
  Filled 2022-10-04: qty 2

## 2022-10-04 MED ORDER — FENTANYL CITRATE PF 50 MCG/ML IJ SOSY
50.0000 ug | PREFILLED_SYRINGE | Freq: Once | INTRAMUSCULAR | Status: AC
  Administered 2022-10-04: 50 ug via INTRAVENOUS
  Filled 2022-10-04: qty 1

## 2022-10-04 NOTE — Discharge Instructions (Signed)
Please read and follow all provided instructions.  Your diagnoses today include:  1. Avulsion of fingertip, initial encounter   2. Laceration of left thumb without foreign body without damage to nail, initial encounter   3. Laceration of left index finger without foreign body with damage to nail, initial encounter   4. Laceration of left middle finger without foreign body with damage to nail, initial encounter   5. Extensor tendon laceration of finger with open wound, initial encounter     Tests performed today include: X-ray showed a small fracture of the tip of the thumb Vital signs. See below for your results today.   Medications prescribed:  Percocet (oxycodone/acetaminophen) - narcotic pain medication  DO NOT drive or perform any activities that require you to be awake and alert because this medicine can make you drowsy. BE VERY CAREFUL not to take multiple medicines containing Tylenol (also called acetaminophen). Doing so can lead to an overdose which can damage your liver and cause liver failure and possibly death.  Clindamycin - antibiotic  You have been prescribed an antibiotic medicine: take the entire course of medicine even if you are feeling better. Stopping early can cause the antibiotic not to work.  Take any prescribed medications only as directed.   Home care instructions:  Follow any educational materials and wound care instructions contained in this packet.   Keep affected area above the level of your heart when possible to minimize swelling. Wash area gently twice a day with warm soapy water. Do not apply alcohol or hydrogen peroxide. Cover the area if it draining or weeping.   Follow-up instructions: It is imperative that you follow-up with orthopedic hand surgery next week for further evaluation and treatment.  You will likely need a repair of your extensor tendon of your index finger.  Return instructions:  Return to the Emergency Department if you  have: Fever Worsening pain Worsening swelling of the wound Pus draining from the wound Redness of the skin that moves away from the wound, especially if it streaks away from the affected area  Any other emergent concerns  Your vital signs today were: BP 100/65   Pulse 71   Temp 97.7 F (36.5 C) (Oral)   Resp 16   SpO2 96%  If your blood pressure (BP) was elevated above 135/85 this visit, please have this repeated by your doctor within one month. --------------

## 2022-10-04 NOTE — ED Provider Notes (Signed)
[place] EMERGENCY DEPARTMENT AT [facility] Provider Note   CSN: [id number] Arrival date & time: 10/04/22  1343     History  Chief Complaint  Patient presents with   Extremity Laceration    Left hand    Jeffery [name] is a 57 y.o. male.  Patient with history of tobacco abuse,  presents to the emergency department today for evaluation of left hand lacerations.  Patient was cutting a sheet of vinyl with a table saw when his hand went into the blades.  He sustained lacerations to the left thumb, left index finger, left long finger.  No treatments prior to arrival.  Patient last ate at 12:30 PM.  Unknown last tetanus.  He is having difficulty moving his index finger.      Home Medications Prior to Admission medications   Medication Sig Start Date End Date Taking? Authorizing Provider  acetaminophen (TYLENOL) 500 MG tablet Take 500-1,000 mg by mouth every 6 (six) hours as needed for mild pain or headache.    [provider]  pantoprazole (PROTONIX) 40 MG tablet Take 1 tablet (40 mg total) by mouth 2 (two) times daily for 14 days. 05/14/21 05/28/21  Jill Alexanders, PA-C  witch hazel-glycerin (TUCKS) pad Apply topically as needed for itching. 05/14/21   Jill Alexanders, PA-C      Allergies    Amoxicillin    Review of Systems   Review of Systems  Physical Exam Updated Vital Signs BP 109/73   Pulse 79   Temp 97.7 F (36.5 C) (Oral)   Resp 20   SpO2 98%   Physical Exam Vitals and nursing note reviewed.  Constitutional:      Appearance: He is well-developed.  HENT:     Head: Normocephalic and atraumatic.  Eyes:     Conjunctiva/sclera: Conjunctivae normal.  Pulmonary:     Effort: No respiratory distress.  Musculoskeletal:     Cervical back: Normal range of motion and neck supple.     Comments: L thumb: 4cm laceration ulnar aspect, distal to IP. Wound base appears clean, wound is hemostatic.  Distal sensation is decreased but intact.   Capillary refill less than 2 seconds.  L index finger: 6cm laceration from proximal to the MCP joint to the PIP along the radial aspect of the digit.  Wound is somewhat macerated especially distally.  There is exposed bone without significant fracture, exposed extensor tendon with obvious injury.  Patient is unable to extend the digit actively.  Distal sensation intact.  Capillary refill less than 2 seconds.  Left index finger: 4cm laceration dorsally from the PIP to the nail plate.  This involves the nail plate and there are several small fragments of nail.  Wound base is clean.  Distal sensation intact.  Capillary refill less than 2 seconds.  L long finger: fingertip avulsion involving nail plate and nail bed radial aspect. Wound base is clean.  Distal sensation intact.  Capillary refill less than 2 seconds.    Skin:    General: Skin is warm and dry.  Neurological:     Mental Status: He is alert.          ED Results / Procedures / Treatments   Labs (all labs ordered are listed, but only abnormal results are displayed) Labs Reviewed  CBC WITH DIFFERENTIAL/PLATELET - Abnormal; Notable for the following components:      Result Value   WBC 11.0 (*)    Monocytes Absolute 1.1 (*)  All other components within normal limits  BASIC METABOLIC PANEL    EKG None  Radiology DG Hand Complete Left  Result Date: 10/04/2022 CLINICAL DATA:  Complex laceration from table saw. Patient has laceration across the left second MCP joint extending down the finger, tip of the third finger, and thumb. Technologist notes patient was unable to flatten hand for images. EXAM: LEFT HAND - COMPLETE 3+ VIEW COMPARISON:  None Available. FINDINGS: Evaluation for fractures is limited secondary to overlapping osseous structures from suboptimal positioning. There is no evidence of obvious dislocation. Soft tissue defects noted at the tips of the thumb, index, and middle fingers. A few punctate radiodensities are  noted within the soft tissue defect at the thumb. Slight cortical irregularity is noted at the tuft of the thumb distal phalanx, deep to the soft tissue defect, seen only on the AP view. IMPRESSION: Slight cortical irregularity of the tuft of the thumb distal phalanx with adjacent subcutaneous emphysema is concerning for possible nondisplaced open fracture. A few punctate radiodensities within the soft tissue defect at the thumb may reflect foreign bodies. No other fracture identified, though evaluation is limited given overlapping structures secondary to suboptimal positioning. CT of the hand could be performed for further evaluation if clinically desired. Soft tissue defects involving the tip of the index and middle fingers. Electronically Signed   By: Ileana Roup M.D.   On: 10/04/2022 14:50    Procedures .Marland KitchenLaceration Repair  Date/Time: 10/04/2022 5:11 PM  Performed by: Carlisle Cater, PA-C Authorized by: Carlisle Cater, PA-C   Consent:    Consent obtained:  Verbal   Consent given by:  Patient   Risks discussed:  Need for additional repair, nerve damage, infection and pain   Alternatives discussed:  No treatment Universal protocol:    Patient identity confirmed:  Verbally with patient and provided demographic data Anesthesia:    Anesthesia method:  Local infiltration   Local anesthetic:  Bupivacaine 0.5% w/o epi Laceration details:    Location:  Finger   Finger location:  L index finger   Length (cm):  4 Pre-procedure details:    Preparation:  Patient was prepped and draped in usual sterile fashion and imaging obtained to evaluate for foreign bodies Exploration:    Imaging obtained: x-ray     Imaging outcome: foreign body not noted     Wound exploration: wound explored through full range of motion and entire depth of wound visualized     Wound extent: no tendon damage and no underlying fracture     Contaminated: no   Treatment:    Area cleansed with:  Saline   Amount of cleaning:   Extensive   Irrigation solution:  Sterile saline   Irrigation volume:  1000cc   Irrigation method:  Pressure wash   Debridement:  Minimal Skin repair:    Repair method:  Sutures   Suture size:  5-0   Suture material:  Nylon   Suture technique:  Simple interrupted   Number of sutures:  4 Approximation:    Approximation:  Loose Repair type:    Repair type:  Intermediate Post-procedure details:    Dressing:  Non-adherent dressing, splint for protection and sterile dressing   Procedure completion:  Tolerated well, no immediate complications Comments:     A total of 2000cc saline, using a bottle cap nozzle under pressure, was used to clean all 4 wounds. Marland Kitchen.Laceration Repair  Date/Time: 10/04/2022 5:11 PM  Performed by: Carlisle Cater, PA-C Authorized by: Carlisle Cater, PA-C  Consent:    Consent obtained:  Verbal   Consent given by:  Patient   Risks discussed:  Nerve damage, need for additional repair, infection, pain, tendon damage and vascular damage Universal protocol:    Patient identity confirmed:  Verbally with patient and provided demographic data Anesthesia:    Anesthesia method:  Local infiltration   Local anesthetic:  Bupivacaine 0.5% w/o epi Laceration details:    Location:  Finger   Finger location:  L index finger   Length (cm):  6 Pre-procedure details:    Preparation:  Patient was prepped and draped in usual sterile fashion and imaging obtained to evaluate for foreign bodies Exploration:    Imaging obtained: x-ray     Imaging outcome: foreign body not noted     Wound exploration: wound explored through full range of motion and entire depth of wound visualized     Wound extent: tendon damage     Wound extent: no foreign body and no underlying fracture     Tendon repair plan:  Refer for evaluation Treatment:    Area cleansed with:  Saline   Amount of cleaning:  Extensive   Irrigation solution:  Sterile saline   Irrigation volume:  1000cc   Irrigation  method:  Pressure wash   Debridement:  Minimal   Undermining:  None Skin repair:    Repair method:  Sutures   Suture size:  5-0   Suture material:  Nylon   Suture technique:  Simple interrupted   Number of sutures:  6 Approximation:    Approximation:  Loose Repair type:    Repair type:  Intermediate Post-procedure details:    Dressing:  Non-adherent dressing, splint for protection and sterile dressing   Procedure completion:  Tolerated well, no immediate complications .Marland KitchenLaceration Repair  Date/Time: 10/04/2022 5:11 PM  Performed by: Carlisle Cater, PA-C Authorized by: Carlisle Cater, PA-C   Consent:    Consent obtained:  Verbal   Consent given by:  Patient   Risks discussed:  Infection, need for additional repair, nerve damage, pain, tendon damage and vascular damage   Alternatives discussed:  No treatment Universal protocol:    Patient identity confirmed:  Verbally with patient and provided demographic data Anesthesia:    Anesthesia method:  Local infiltration   Local anesthetic:  Bupivacaine 0.5% w/o epi Laceration details:    Location:  Finger   Finger location:  L thumb   Length (cm):  4 Pre-procedure details:    Preparation:  Patient was prepped and draped in usual sterile fashion and imaging obtained to evaluate for foreign bodies Exploration:    Imaging obtained: x-ray     Imaging outcome: foreign body not noted     Wound exploration: wound explored through full range of motion and entire depth of wound visualized     Wound extent: underlying fracture     Wound extent: no foreign body     Contaminated: no   Treatment:    Area cleansed with:  Saline   Amount of cleaning:  Extensive   Irrigation solution:  Sterile saline   Irrigation volume:  1000cc   Irrigation method:  Pressure wash (with bottle cap)   Visualized foreign bodies/material removed: no     Debridement:  None   Undermining:  None Skin repair:    Repair method:  Sutures   Suture size:  5-0    Suture material:  Nylon   Suture technique:  Simple interrupted   Number of sutures:  3 Approximation:  Approximation:  Loose Repair type:    Repair type:  Simple Post-procedure details:    Dressing:  Non-adherent dressing and bulky dressing   Procedure completion:  Tolerated well, no immediate complications     Medications Ordered in ED Medications  clindamycin (CLEOCIN) IVPB 900 mg (0 mg Intravenous Stopped 10/04/22 1511)  Tdap (BOOSTRIX) injection 0.5 mL (0.5 mLs Intramuscular Given 10/04/22 1433)  sodium chloride 0.9 % bolus 1,000 mL (0 mLs Intravenous Stopped 10/04/22 1441)  fentaNYL (SUBLIMAZE) injection 50 mcg (50 mcg Intravenous Given 10/04/22 1432)  HYDROmorphone (DILAUDID) injection 0.5 mg (0.5 mg Intravenous Given 10/04/22 1542)  ondansetron (ZOFRAN) injection 4 mg (4 mg Intravenous Given 10/04/22 1541)  bupivacaine(PF) (MARCAINE) 0.5 % injection 20 mL (20 mLs Infiltration Given by Other 10/04/22 1543)    ED Course/ Medical Decision Making/ A&P    Patient seen and examined. History obtained directly from patient.   Labs/EKG: Ordered CBC, BMP.  Imaging: Ordered X-ray of the left hand.  Medications/Fluids: Ordered: clindamycin 2/2 concern for open fracture (amoxicillin allergy) and fentanyl.    Most recent vital signs reviewed and are as follows: BP 90/61   Pulse (!) 53   Temp 97.7 F (36.5 C) (Oral)   Resp 20   SpO2 100%   Initial impression: complex lacerations of the left hand with suspected extensor tendon injury.   5:10 PM Reassessment performed. Patient appears stable.  I discussed the patient earlier with Dr. Dennis Bast and then with orthopedic hand surgery, Dr. Greta Doom.  He has reviewed the imaging in the chart as well as the x-ray.  States that patient will need follow-up in the office next week.  He request that I clean and loosely close the wounds.  This was performed as above.  Labs personally reviewed and interpreted including:  Imaging personally  visualized and interpreted including:  Reviewed pertinent lab work and imaging with patient at bedside. Questions answered.   Most current vital signs reviewed and are as follows: BP 100/65   Pulse 71   Temp 97.7 F (36.5 C) (Oral)   Resp 16   SpO2 96%   Plan: Finger splint.  Will discharged home with prescription for clindamycin (penicillin allergy), pain medication.  Patient counseled on use of narcotic pain medications. Counseled not to combine these medications with others containing tylenol. Urged not to drink alcohol, drive, or perform any other activities that requires focus while taking these medications. The patient verbalizes understanding and agrees with the plan.  5:16 PM Patient counseled on wound care. Patient counseled on need to follow-up with orthopedic hand surgery referral. Patient was urged to return to the Emergency Department urgently with worsening pain, swelling, expanding erythema especially if it streaks away from the affected area, fever, or if they have any other concerns. Patient verbalized understanding.                               Medical Decision Making Amount and/or Complexity of Data Reviewed Labs: ordered. Radiology: ordered.  Risk Prescription drug management.   Patient with complex lacerations of left hand after tablesaw injury.  Patient has open fracture to the thumb, extensor tendon laceration of the index finger, and fingertip avulsion of the middle finger.  Orthopedic hand recommendations obtained and patient will follow-up next week.  Wounds were loosely closed.   The patient's vital signs, pertinent lab work and imaging were reviewed and interpreted as discussed in the ED course. Hospitalization was  considered for further testing, treatments, or serial exams/observation. However as patient is well-appearing, has a stable exam, and reassuring studies today, I do not feel that they warrant admission at this time. This plan was discussed  with the patient who verbalizes agreement and comfort with this plan and seems reliable and able to return to the Emergency Department with worsening or changing symptoms.          Final Clinical Impression(s) / ED Diagnoses Final diagnoses:  Laceration of left thumb without foreign body without damage to nail, initial encounter  Laceration of left index finger without foreign body with damage to nail, initial encounter  Avulsion of fingertip, initial encounter  Laceration of left middle finger without foreign body with damage to nail, initial encounter  Extensor tendon laceration of finger with open wound, initial encounter    Rx / DC Orders ED Discharge Orders          Ordered    clindamycin (CLEOCIN) 150 MG capsule  Every 6 hours        10/04/22 1706    oxyCODONE-acetaminophen (PERCOCET/ROXICET) 5-325 MG tablet  Every 6 hours PRN        10/04/22 1706              Carlisle Cater, PA-C 10/04/22 1718    Tretha Sciara, MD 10/05/22 [phone]

## 2022-10-04 NOTE — ED Triage Notes (Addendum)
Patient states he was using a table saw to cut vinyl when he cut his left hand. Pt came straight to the ED after.

## 2023-04-01 IMAGING — RF DG UGI W SINGLE CM
12 series · 12 of 12 positions shown · IV contrast (omnipaque)
Comparison: May 07, 2021.

CLINICAL DATA: Perforated gastric ulcer. Status post exploratory
laparotomy.

EXAM:
WATER SOLUBLE UPPER GI SERIES
TECHNIQUE: Single-column upper GI series was performed using water soluble
contrast.
CONTRAST:  25 mL of Omnipaque 300 intravenously.

[Series 1: t abdomen supine · 0.15mm/px · 1 of 1 slices shown]
[im 1/1]
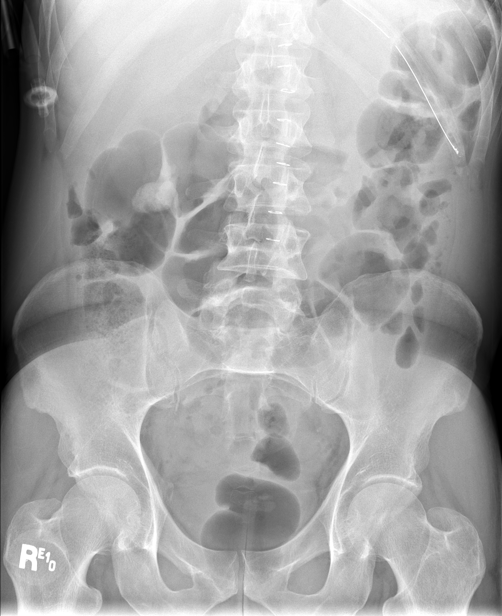

[Series 2: cp_standard · 0.26mm/px · 1 of 1 slices shown (1 of 2)]
[im 1/1]
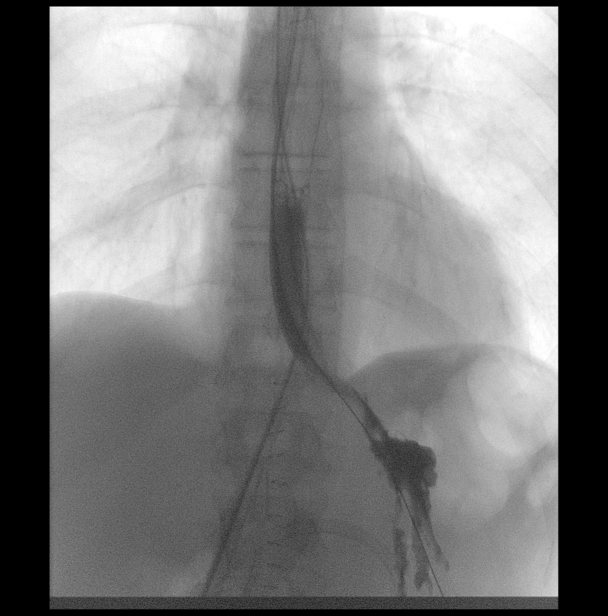

[Series 3: fluoro_barium singleshot_bw · 0.17mm/px · 1 of 1 slices shown (1 of 9)]
[im 1/1]
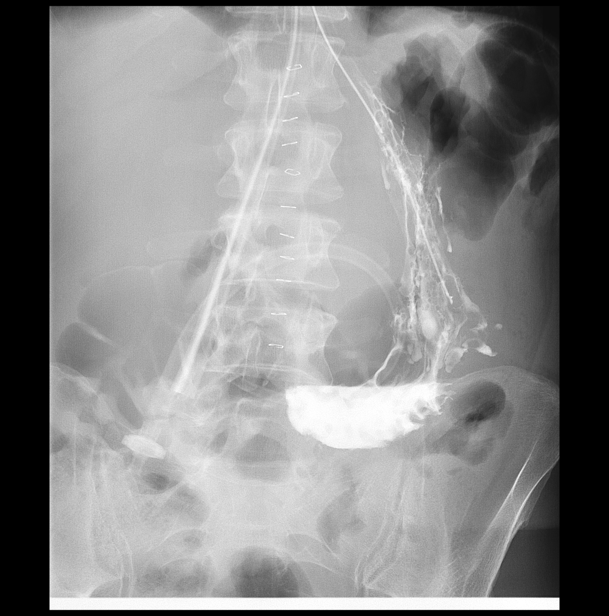

[Series 4: fluoro_barium singleshot_bw · 0.17mm/px · 1 of 1 slices shown (2 of 9)]
[im 1/1]
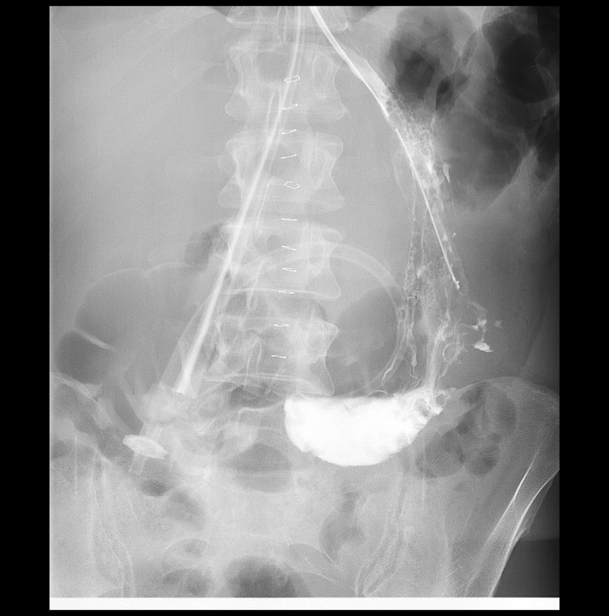

[Series 5: fluoro_barium singleshot_bw · 0.18mm/px · 1 of 1 slices shown (3 of 9)]
[im 1/1]
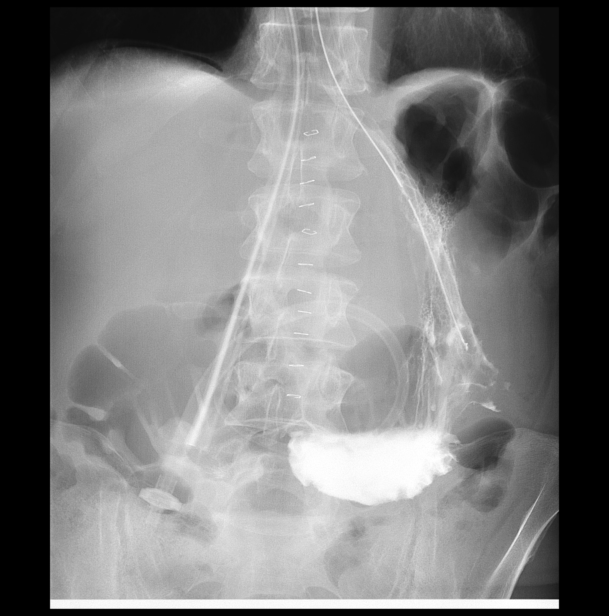

[Series 6: fluoro_barium singleshot_bw · 0.17mm/px · 1 of 1 slices shown (4 of 9)]
[im 1/1]
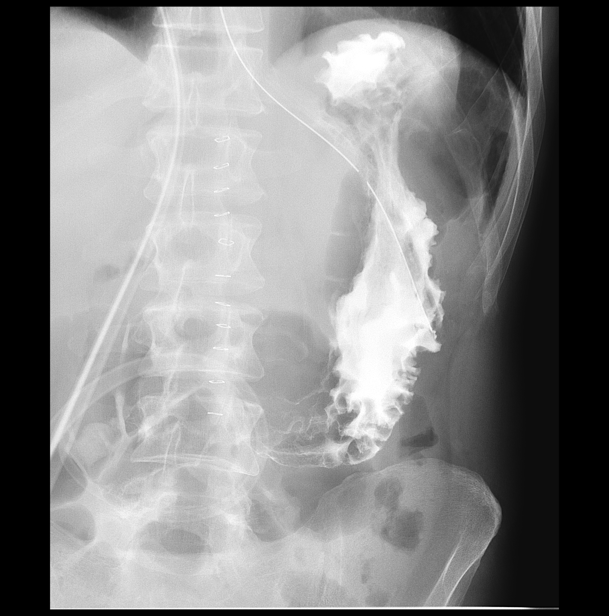

[Series 7: fluoro_barium singleshot_bw · 0.17mm/px · 1 of 1 slices shown (5 of 9)]
[im 1/1]
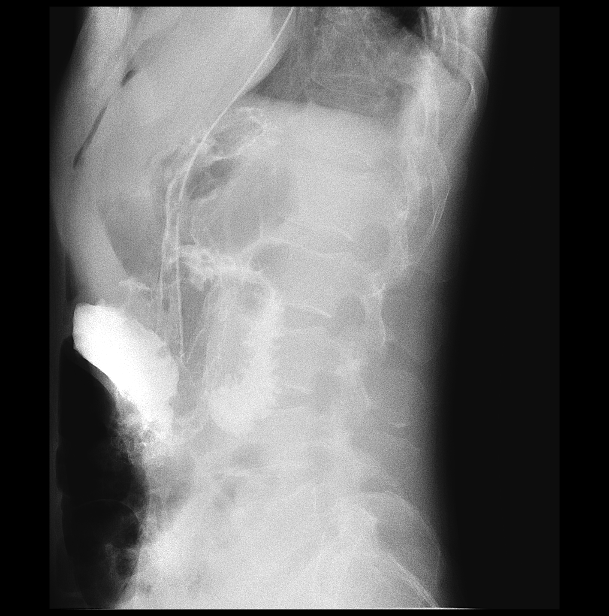

[Series 8: fluoro_barium singleshot_bw · 0.17mm/px · 1 of 1 slices shown (6 of 9)]
[im 1/1]
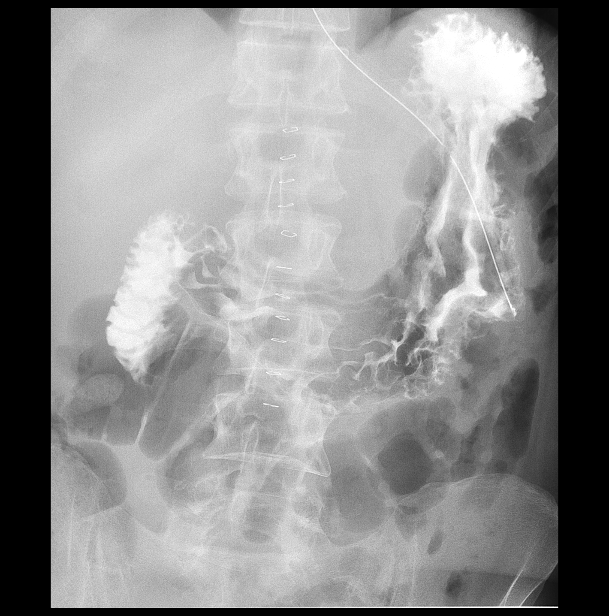

[Series 9: fluoro_barium singleshot_bw · 0.19mm/px · 1 of 1 slices shown (7 of 9)]
[im 1/1]
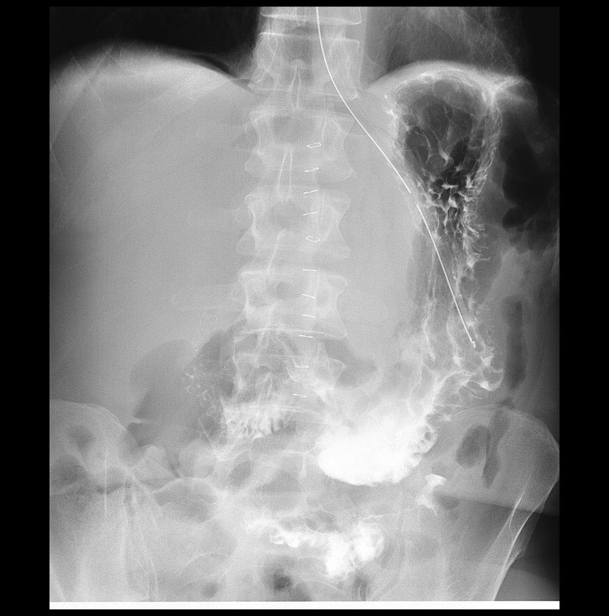

[Series 10: fluoro_barium singleshot_bw · 0.19mm/px · 1 of 1 slices shown (8 of 9)]
[im 1/1]
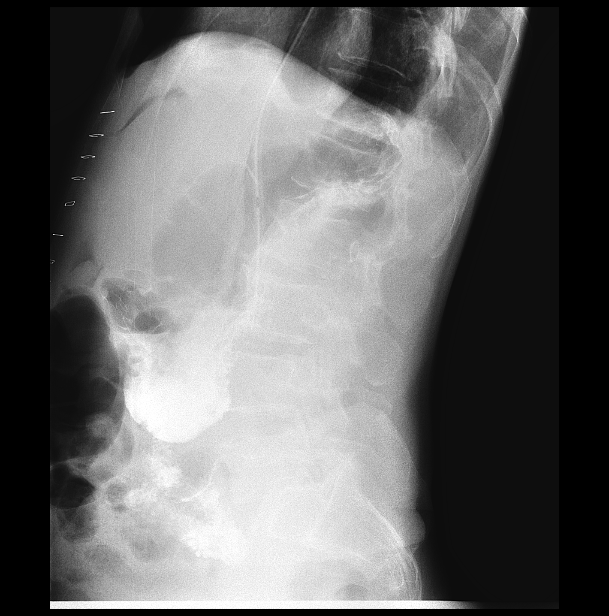

[Series 11: fluoro_barium singleshot_bw · 0.19mm/px · 1 of 1 slices shown (9 of 9)]
[im 1/1]
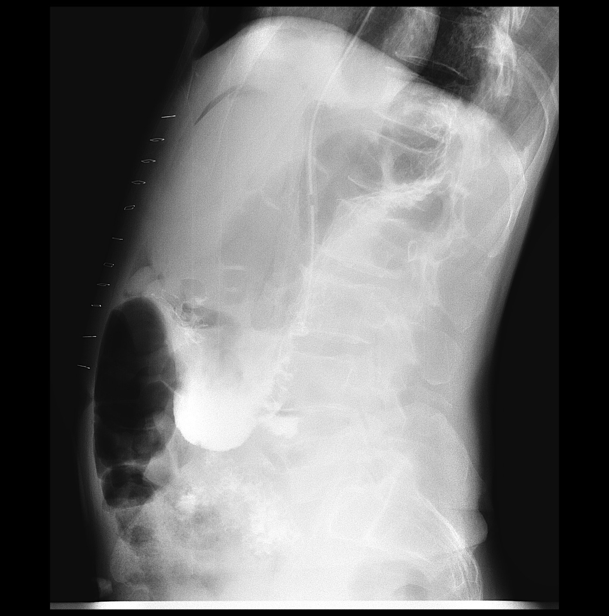

[Series 12: cp_standard · 0.28mm/px · 1 of 1 slices shown (2 of 2)]
[im 1/1]
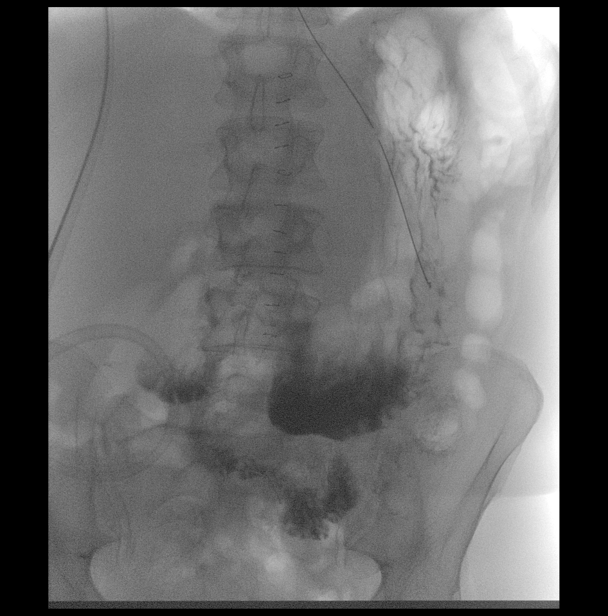

[12 of 12 positions shown; findings below may reference images not displayed]

FLUOROSCOPY TIME:  Radiation Exposure Index (if provided by the
fluoroscopic device): 20.9 mGy.
FINDINGS: Nasogastric tube tip is seen in proximal stomach. Contrast filling
of nondilated stomach is noted. Contrast flowed from the stomach
into the proximal duodenum. No definite evidence of extravasation or
leakage is noted.
IMPRESSION: No definite evidence of contrast extravasation or leakage is noted.
